# Patient Record
Sex: Female | Born: 2000 | Race: Black or African American | Hispanic: No | Marital: Single | Smoking: Never smoker
Health system: Southern US, Community
[De-identification: ages and names within clinical notes are randomized; demographics above are authoritative.]

## PROBLEM LIST (undated history)

## (undated) DIAGNOSIS — L309 Dermatitis, unspecified: Secondary | ICD-10-CM

---
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
## 2018-03-12 ENCOUNTER — Encounter (HOSPITAL_COMMUNITY): Payer: Self-pay | Admitting: Emergency Medicine

## 2018-03-12 ENCOUNTER — Emergency Department (HOSPITAL_COMMUNITY)
Admission: EM | Admit: 2018-03-12 | Discharge: 2018-03-12 | Disposition: A | Discharge status: Home / Self Care | Payer: BLUE CROSS/BLUE SHIELD | Attending: Emergency Medicine | Admitting: Emergency Medicine

## 2018-03-12 ENCOUNTER — Other Ambulatory Visit: Payer: Self-pay

## 2018-03-12 DIAGNOSIS — Z79899 Other long term (current) drug therapy: Secondary | ICD-10-CM | POA: Insufficient documentation

## 2018-03-12 DIAGNOSIS — J45909 Unspecified asthma, uncomplicated: Secondary | ICD-10-CM | POA: Insufficient documentation

## 2018-03-12 DIAGNOSIS — J029 Acute pharyngitis, unspecified: Secondary | ICD-10-CM | POA: Diagnosis present

## 2018-03-12 LAB — GROUP A STREP BY PCR: Group A Strep by PCR: NOT DETECTED

## 2018-03-12 MED ORDER — DEXAMETHASONE 10 MG/ML FOR PEDIATRIC ORAL USE
10.0000 mg | Freq: Once | INTRAMUSCULAR | Status: AC
  Administered 2018-03-12: 10 mg via ORAL
  Filled 2018-03-12 (×3): qty 1

## 2018-03-12 NOTE — ED Provider Notes (Signed)
[facility] Provider Note   CSN: [id number] Arrival date & time: 03/12/18  0946     History   Chief Complaint Chief Complaint  Patient presents with  . Sore Throat    HPI Tonya Harding is a 17 y.o. female.  17 year old female presents with complaint of sore throat, onset yesterday.  Also reports pain in her ears, sore throat worse with swallowing.  Denies fevers, chills, cough, nausea, vomiting or congestion.  Exposed to her roommate who has had a cold.  No other complaints or concerns.     Past Medical History:  Diagnosis Date  . Asthma   . Eczema     Patient Active Problem List   Diagnosis Date Noted  . Eczema 11/14/2014  . Unspecified constipation 10/29/2013  . ALLERGIC RHINITIS 01/19/2008  . ASTHMA, INTERMITTENT 01/19/2008    History reviewed. No pertinent surgical history.   OB History   None      Home Medications    Prior to Admission medications   Medication Sig Start Date End Date Taking? Authorizing Provider  albuterol (PROAIR HFA) 108 (90 Base) MCG/ACT inhaler Inhale 2 puffs into the lungs every 4 (four) hours as needed for shortness of breath. 12/21/16   Palma HolterGunadasa, Kanishka G, MD  cetirizine (ZYRTEC) 10 MG tablet TAKE 1 TABLET (10 MG TOTAL) BY MOUTH DAILY AS NEEDED FOR ALLERGIES. 12/18/14   Glori LuisSonnenberg, Eric G, MD  ibuprofen (ADVIL,MOTRIN) 600 MG tablet Take 1 tablet (600 mg total) by mouth every 6 (six) hours as needed for mild pain. 03/14/16   Lowanda FosterBrewer, Mindy, NP  norgestimate-ethinyl estradiol (ORTHO-CYCLEN,SPRINTEC,PREVIFEM) 0.25-35 MG-MCG tablet Take 1 tablet by mouth daily. 01/10/17   Palma HolterGunadasa, Kanishka G, MD  triamcinolone cream (KENALOG) 0.1 % APPLY TOPICALLY TWICE DAILY FOR 5 DAYS, THEN TWICE DAILY AS NEEDED FOR RASH 09/21/16   Palma HolterGunadasa, Kanishka G, MD    Family History Family History  Problem Relation Age of Onset  . Sleep apnea Mother   . Diabetes Sister        23yo  . Thyroid disease Sister   . Heart  failure Maternal Grandmother   . Hypertension Maternal Grandmother   . Hyperlipidemia Maternal Grandmother   . Diabetes Maternal Grandmother   . Diabetes Paternal Grandmother   . Stroke Paternal Grandmother   . Hyperlipidemia Paternal Grandmother     Social History Social History   Tobacco Use  . Smoking status: Never Smoker  . Smokeless tobacco: Never Used  Substance Use Topics  . Alcohol use: No  . Drug use: Not on file     Allergies   Penicillins   Review of Systems Review of Systems  Constitutional: Negative for chills and fever.  HENT: Positive for ear pain and sore throat. Negative for congestion, postnasal drip, rhinorrhea, sinus pressure, sinus pain, sneezing, trouble swallowing and voice change.   Eyes: Negative for discharge and redness.  Respiratory: Negative for cough, shortness of breath and wheezing.   Musculoskeletal: Negative for arthralgias and myalgias.  Skin: Negative for rash and wound.  Allergic/Immunologic: Negative for immunocompromised state.  Neurological: Negative for weakness and headaches.  Hematological: Negative for adenopathy.  All other systems reviewed and are negative.    Physical Exam Updated Vital Signs BP 119/79 (BP Location: Right Arm)   Pulse 96   Temp 99.4 F (37.4 C) (Oral)   Resp 20   Ht 5\' 6"  (1.676 m)   Wt 58.1 kg   LMP 03/05/2018   SpO2 99%  BMI 20.66 kg/m   Physical Exam  Constitutional: She is oriented to person, place, and time. She appears well-developed and well-nourished. No distress.  HENT:  Head: Normocephalic and atraumatic.  Right Ear: Tympanic membrane normal.  Left Ear: Tympanic membrane and ear canal normal.  Mouth/Throat: Uvula is midline and mucous membranes are normal. No uvula swelling. Posterior oropharyngeal erythema present. No oropharyngeal exudate, posterior oropharyngeal edema or tonsillar abscesses. Tonsils are 1+ on the right. Tonsils are 1+ on the left. No tonsillar exudate.  Neck:  Neck supple.  Cardiovascular: Normal rate, regular rhythm, normal heart sounds and intact distal pulses.  Pulmonary/Chest: Effort normal and breath sounds normal.  Lymphadenopathy:    She has no cervical adenopathy.  Neurological: She is alert and oriented to person, place, and time.  Skin: Skin is warm and dry. No rash noted. She is not diaphoretic.  Psychiatric: She has a normal mood and affect. Her behavior is normal.  Nursing note and vitals reviewed.    ED Treatments / Results  Labs (all labs ordered are listed, but only abnormal results are displayed) Labs Reviewed  GROUP A STREP BY PCR    EKG None  Radiology No results found.  Procedures Procedures (including critical care time)  Medications Ordered in ED Medications  dexamethasone (DECADRON) 10 MG/ML injection for Pediatric ORAL use 10 mg (has no administration in time range)     Initial Impression / Assessment and Plan / ED Course  I have reviewed the triage vital signs and the nursing notes.  Pertinent labs & imaging results that were available during my care of the patient were reviewed by me and considered in my medical decision making (see chart for details).  Clinical Course as of Mar 12 1117  Sun Mar 12, 2018  [id number] 17 year old female with complaint of sore throat and ear pain onset yesterday.  Rapid strep test is negative.  Patient was given a dose of Decadron to gargle and swallow while in the ER.  Recheck with PCP.   [LM]    Clinical Course User Index [LM] Jeannie Fend, PA-C   Final Clinical Impressions(s) / ED Diagnoses   Final diagnoses:  Viral pharyngitis    ED Discharge Orders    None       Alden Hipp 03/12/18 1118    Benjiman Core, MD 03/12/18 [phone]

## 2018-03-12 NOTE — ED Triage Notes (Signed)
Pt. Stated, Tonya Harding had a sore throat since yesterday

## 2018-03-12 NOTE — ED Notes (Signed)
Waiting on the med from pharm

## 2018-03-12 NOTE — Discharge Instructions (Addendum)
Your strep test today was negative.  Your sore throat is likely caused by a virus.  You were given a dose of Decadron while in the emergency room, this is a steroid medication that should help improve your pain over the next 24 hours.  You can also gargle liquid Motrin or liquid Tylenol and swallow for additional pain relief.  Recheck with student health or your primary care provider in the next 2 days, return to ER for worsening symptoms.

## 2018-06-13 ENCOUNTER — Other Ambulatory Visit: Payer: Self-pay | Admitting: Internal Medicine

## 2018-08-27 ENCOUNTER — Other Ambulatory Visit: Payer: Self-pay

## 2018-08-27 ENCOUNTER — Emergency Department (HOSPITAL_COMMUNITY)
Admission: EM | Admit: 2018-08-27 | Discharge: 2018-08-27 | Disposition: A | Discharge status: Home / Self Care | Payer: BLUE CROSS/BLUE SHIELD | Attending: Emergency Medicine | Admitting: Emergency Medicine

## 2018-08-27 ENCOUNTER — Encounter (HOSPITAL_COMMUNITY): Payer: Self-pay | Admitting: Emergency Medicine

## 2018-08-27 DIAGNOSIS — R69 Illness, unspecified: Secondary | ICD-10-CM

## 2018-08-27 DIAGNOSIS — J111 Influenza due to unidentified influenza virus with other respiratory manifestations: Secondary | ICD-10-CM | POA: Insufficient documentation

## 2018-08-27 DIAGNOSIS — Z79899 Other long term (current) drug therapy: Secondary | ICD-10-CM | POA: Insufficient documentation

## 2018-08-27 DIAGNOSIS — J45909 Unspecified asthma, uncomplicated: Secondary | ICD-10-CM | POA: Insufficient documentation

## 2018-08-27 MED ORDER — ONDANSETRON 4 MG PO TBDP: 4.0000 mg | ORAL_TABLET | Freq: Three times a day (TID) | ORAL | 0 refills | Status: DC | PRN

## 2018-08-27 MED ORDER — SODIUM CHLORIDE 0.9 % IV BOLUS
1000.0000 mL | Freq: Once | INTRAVENOUS | Status: AC
  Administered 2018-08-27: 1000 mL via INTRAVENOUS

## 2018-08-27 MED ORDER — ONDANSETRON HCL 4 MG/2ML IJ SOLN
4.0000 mg | Freq: Once | INTRAMUSCULAR | Status: AC
  Administered 2018-08-27: 4 mg via INTRAVENOUS
  Filled 2018-08-27: qty 2

## 2018-08-27 MED ORDER — IBUPROFEN 800 MG PO TABS: 800.0000 mg | ORAL_TABLET | Freq: Three times a day (TID) | ORAL | 0 refills | Status: DC | PRN

## 2018-08-27 MED ORDER — KETOROLAC TROMETHAMINE 30 MG/ML IJ SOLN
30.0000 mg | Freq: Once | INTRAMUSCULAR | Status: AC
  Administered 2018-08-27: 30 mg via INTRAVENOUS
  Filled 2018-08-27: qty 1

## 2018-08-27 MED ORDER — ACETAMINOPHEN 500 MG PO TABS
1000.0000 mg | ORAL_TABLET | Freq: Once | ORAL | Status: AC
  Administered 2018-08-27: 1000 mg via ORAL
  Filled 2018-08-27 (×2): qty 2

## 2018-08-27 MED ORDER — OSELTAMIVIR PHOSPHATE 75 MG PO CAPS: 75.0000 mg | ORAL_CAPSULE | Freq: Two times a day (BID) | ORAL | 0 refills | Status: DC

## 2018-08-27 NOTE — Discharge Instructions (Addendum)
It was my pleasure taking care of you today!   Alternate between Tylenol (500-1000mg ) and Ibuprofen every 4-6 hours as needed for fevers / body aches.   Zofran as needed for nausea.   Follow up with your primary care doctor if you are not feeling better in the next 5-7 days.   Return to ER for difficulty breathing, uncontrolled vomiting, new or worsening symptoms, any additional concerns.

## 2018-08-27 NOTE — ED Triage Notes (Signed)
Patient arrives via [facility] for c/o chills, fever, body aches since yesterday. Recent exposure to sick family members. vss 95 hr cbg 140, bp 134 palpated

## 2018-08-27 NOTE — ED Notes (Signed)
Pt states she took ibuprofen yesterday and tried to take liquid tylenol today but vomited afterwards.

## 2018-08-27 NOTE — ED Provider Notes (Signed)
[facility] EMERGENCY DEPARTMENT Provider Note   CSN: [id number] Arrival date & time: 08/27/18  0737    History   Chief Complaint Chief Complaint  Patient presents with  . Generalized Body Aches  . Fever  . Chills    HPI Tonya Harding is a 18 y.o. female.     The history is provided by the patient and medical records. No language interpreter was used.  Fever  Associated symptoms: chills, congestion, myalgias, nausea and vomiting   Associated symptoms: no chest pain, no cough, no diarrhea, no dysuria and no rash    Tonya Harding is a 18 y.o. female  with a PMH of asthma, eczema who presents to the Emergency Department complaining of acute onset of persistent generalized body aches, chills and headache which began about 6 PM last night.  Nauseated with nausea.  She tried to take both Tylenol and then liquid ibuprofen earlier this morning, but immediately threw up.  She has not tried to eat or drink anything since then out of concerns for emesis.  She was unaware that she had a fever as she did not check her temperature at home, but does states that she has not surprised by this because of how she feels.  Does have sick contact with URI, but is unaware of any flu contacts.  Denies any chest pain or trouble breathing.  Denies abdominal pain, constipation, diarrhea or blood in the stool.  Past Medical History:  Diagnosis Date  . Asthma   . Eczema     Patient Active Problem List   Diagnosis Date Noted  . Eczema 11/14/2014  . Unspecified constipation 10/29/2013  . ALLERGIC RHINITIS 01/19/2008  . ASTHMA, INTERMITTENT 01/19/2008    History reviewed. No pertinent surgical history.   OB History   No obstetric history on file.      Home Medications    Prior to Admission medications   Medication Sig Start Date End Date Taking? Authorizing Provider  cetirizine (ZYRTEC) 10 MG tablet TAKE 1 TABLET (10 MG TOTAL) BY MOUTH DAILY AS NEEDED FOR ALLERGIES. 12/18/14    Glori Luis, MD  ibuprofen (ADVIL,MOTRIN) 800 MG tablet Take 1 tablet (800 mg total) by mouth every 8 (eight) hours as needed (Fever, body aches). 08/27/18   , Chase Picket, PA-C  norgestimate-ethinyl estradiol (ORTHO-CYCLEN,SPRINTEC,PREVIFEM) 0.25-35 MG-MCG tablet Take 1 tablet by mouth daily. 01/10/17   Palma Holter, MD  ondansetron (ZOFRAN ODT) 4 MG disintegrating tablet Take 1 tablet (4 mg total) by mouth every 8 (eight) hours as needed for nausea or vomiting. 08/27/18   , Chase Picket, PA-C  oseltamivir (TAMIFLU) 75 MG capsule Take 1 capsule (75 mg total) by mouth every 12 (twelve) hours. 08/27/18   , Chase Picket, PA-C  PROAIR HFA 108 [id number] Base) MCG/ACT inhaler INHALE 2 PUFFS EVERY 4 HOURS AS NEEDED FOR SHORTNESS OF BREATH 06/15/18   Diallo, Abdoulaye, MD  triamcinolone cream (KENALOG) 0.1 % APPLY TOPICALLY TWICE DAILY FOR 5 DAYS, THEN TWICE DAILY AS NEEDED FOR RASH 09/21/16   Palma Holter, MD    Family History Family History  Problem Relation Age of Onset  . Sleep apnea Mother   . Diabetes Sister        23yo  . Thyroid disease Sister   . Heart failure Maternal Grandmother   . Hypertension Maternal Grandmother   . Hyperlipidemia Maternal Grandmother   . Diabetes Maternal Grandmother   . Diabetes Paternal Grandmother   . Stroke  Paternal Grandmother   . Hyperlipidemia Paternal Grandmother     Social History Social History   Tobacco Use  . Smoking status: Never Smoker  . Smokeless tobacco: Never Used  Substance Use Topics  . Alcohol use: No  . Drug use: Not on file     Allergies   Penicillins   Review of Systems Review of Systems  Constitutional: Positive for chills and fever.  HENT: Positive for congestion.   Respiratory: Negative for cough and shortness of breath.   Cardiovascular: Negative for chest pain.  Gastrointestinal: Positive for nausea and vomiting. Negative for abdominal pain, blood in stool, constipation and diarrhea.    Genitourinary: Negative for dysuria.  Musculoskeletal: Positive for arthralgias and myalgias. Negative for back pain and neck pain.  Skin: Negative for rash.     Physical Exam Updated Vital Signs BP 109/71 (BP Location: Left Arm)   Pulse (!) 115   Temp (!) 100.7 F (38.2 C) (Oral)   Resp 16   Ht  (1.676 m)   Wt 61.2 kg   SpO2 98%   BMI 21.79 kg/m   Physical Exam Vitals signs and nursing note reviewed.  Constitutional:      General: She is not in acute distress.    Appearance: She is well-developed.  HENT:     Head: Normocephalic and atraumatic.     Mouth/Throat:     Comments: No tonsillar hypertrophy or exudates. Neck:     Musculoskeletal: Neck supple.     Comments: No meningeal signs. Cardiovascular:     Heart sounds: Normal heart sounds. No murmur.     Comments: Tachycardic, but regular. Pulmonary:     Effort: Pulmonary effort is normal. No respiratory distress.     Breath sounds: Normal breath sounds.     Comments: Lungs are clear to auscultation bilaterally. Abdominal:     General: There is no distension.     Palpations: Abdomen is soft.     Tenderness: There is no abdominal tenderness.     Comments: No abdominal tenderness.  Skin:    General: Skin is warm and dry.  Neurological:     Mental Status: She is alert and oriented to person, place, and time.      ED Treatments / Results  Labs (all labs ordered are listed, but only abnormal results are displayed) Labs Reviewed - No data to display  EKG None  Radiology No results found.  Procedures Procedures (including critical care time)  Medications Ordered in ED Medications  acetaminophen (TYLENOL) tablet 1,000 mg (1,000 mg Oral Given 08/27/18 0946)  ketorolac (TORADOL) 30 MG/ML injection 30 mg (30 mg Intravenous Given 08/27/18 0919)  ondansetron (ZOFRAN) injection 4 mg (4 mg Intravenous Given 08/27/18 0919)  sodium chloride 0.9 % bolus 1,000 mL (0 mLs Intravenous Stopped 08/27/18 1022)      Initial Impression / Assessment and Plan / ED Course  I have reviewed the triage vital signs and the nursing notes.  Pertinent labs & imaging results that were available during my care of the patient were reviewed by me and considered in my medical decision making (see chart for details).        MDM Number of Diagnoses or Management Options Influenza-like illness:   Tonya Harding is a 17 y.o. female who presents to ED for influenza-like-illness.  On exam, patient initially from a temperature up to 102.6.  Antipyretics and liter of fluids.  Lungs are clear to auscultation bilaterally.  She tried to take antipyretics  at home, but threw it up.  Given Zofran in ED.  On reevaluation, patient now tolerating p.o.  Taken Tylenol here in ED without difficulty.  She feels much improved.  We discussed the risks and benefits of Tamiflu and she would like to proceed with treatment.  Rx given. Patient discharged home with instructions to orally hydrate, rest, and ibuprofen/Tylenol for fever/body aches. Rx for Zofran also given. PCP follow up strongly encouraged. Reasons to return to ER discussed. All questions answered.   Final Clinical Impressions(s) / ED Diagnoses   Final diagnoses:  Influenza-like illness    ED Discharge Orders         Ordered    ibuprofen (ADVIL,MOTRIN) 800 MG tablet  Every 8 hours PRN     08/27/18 1014    ondansetron (ZOFRAN ODT) 4 MG disintegrating tablet  Every 8 hours PRN     08/27/18 1014    oseltamivir (TAMIFLU) 75 MG capsule  Every 12 hours     08/27/18 1014           , Chase Picket, PA-C 08/27/18 1046    Shaune Pollack, MD 09/01/18 0401

## 2019-12-24 ENCOUNTER — Encounter (HOSPITAL_COMMUNITY): Payer: Self-pay

## 2019-12-24 ENCOUNTER — Emergency Department (HOSPITAL_COMMUNITY)
Admission: EM | Admit: 2019-12-24 | Discharge: 2019-12-24 | Disposition: A | Discharge status: Home / Self Care | Payer: Medicaid Other | Attending: Emergency Medicine | Admitting: Emergency Medicine

## 2019-12-24 ENCOUNTER — Other Ambulatory Visit: Payer: Self-pay

## 2019-12-24 ENCOUNTER — Emergency Department (HOSPITAL_COMMUNITY): Payer: Medicaid Other

## 2019-12-24 DIAGNOSIS — R0789 Other chest pain: Secondary | ICD-10-CM | POA: Insufficient documentation

## 2019-12-24 DIAGNOSIS — M25512 Pain in left shoulder: Secondary | ICD-10-CM | POA: Insufficient documentation

## 2019-12-24 DIAGNOSIS — F17200 Nicotine dependence, unspecified, uncomplicated: Secondary | ICD-10-CM | POA: Insufficient documentation

## 2019-12-24 LAB — CBC
HCT: 39.7 % (ref 36.0–46.0)
Hemoglobin: 12.6 g/dL (ref 12.0–15.0)
MCH: 26.2 pg (ref 26.0–34.0)
MCHC: 31.7 g/dL (ref 30.0–36.0)
MCV: 82.5 fL (ref 80.0–100.0)
Platelets: 226 10*3/uL (ref 150–400)
RBC: 4.81 MIL/uL (ref 3.87–5.11)
RDW: 13.2 % (ref 11.5–15.5)
WBC: 5.3 10*3/uL (ref 4.0–10.5)
nRBC: 0 % (ref 0.0–0.2)

## 2019-12-24 LAB — BASIC METABOLIC PANEL
Anion gap: 9 (ref 5–15)
BUN: 7 mg/dL (ref 6–20)
CO2: 24 mmol/L (ref 22–32)
Calcium: 8.7 mg/dL — ABNORMAL LOW (ref 8.9–10.3)
Chloride: 104 mmol/L (ref 98–111)
Creatinine, Ser: 0.65 mg/dL (ref 0.44–1.00)
GFR calc Af Amer: >60 mL/min (ref >60)
GFR calc non Af Amer: >60 mL/min (ref >60)
Glucose, Bld: 92 mg/dL (ref 70–99)
Potassium: 3.5 mmol/L (ref 3.5–5.1)
Sodium: 137 mmol/L (ref 135–145)

## 2019-12-24 LAB — D-DIMER, QUANTITATIVE: D-Dimer, Quant: <0.27 ug/mL-FEU (ref 0.00–0.50)

## 2019-12-24 LAB — I-STAT BETA HCG BLOOD, ED (MC, WL, AP ONLY): I-stat hCG, quantitative: <5 m[IU]/mL (ref <5)

## 2019-12-24 LAB — TROPONIN I (HIGH SENSITIVITY): Troponin I (High Sensitivity): 3 ng/L (ref <18)

## 2019-12-24 MED ORDER — ALUM & MAG HYDROXIDE-SIMETH 200-200-20 MG/5ML PO SUSP
30.0000 mL | Freq: Once | ORAL | Status: AC
  Administered 2019-12-24: 30 mL via ORAL
  Filled 2019-12-24: qty 30

## 2019-12-24 MED ORDER — LIDOCAINE VISCOUS HCL 2 % MT SOLN
15.0000 mL | Freq: Once | OROMUCOSAL | Status: AC
Start: 2019-12-24 — End: 2019-12-24
  Administered 2019-12-24: 15 mL via ORAL
  Filled 2019-12-24: qty 15

## 2019-12-24 MED ORDER — OMEPRAZOLE 20 MG PO CPDR: 20.0000 mg | DELAYED_RELEASE_CAPSULE | Freq: Every day | ORAL | 0 refills | Status: DC

## 2019-12-24 NOTE — ED Provider Notes (Signed)
[facility] EMERGENCY DEPARTMENT Provider Note   CSN: [id number] Arrival date & time: 12/24/19  1020     History Chief Complaint  Patient presents with  . Chest Pain    Tonya Harding is a 19 y.o. female.  19 year old female presents with complaint of midsternal chest discomfort.  Patient states pain started at 2:00 this morning, she was awake at that time, had just eaten peas and alfredo pasta.  Patient tried sitting up in bed and drinking some water, no relief.  Reports pain is in aching pain, does not radiate, is worse with taking a deep breath.  Pain has improved as of this morning but is still present.  Also reports discomfort in her left shoulder, worse with palpation and movement.  Denies abdominal pain, nausea, vomiting, shortness of breath.  Patient recently stopped smoking weed, does vape.  Family history significant for grandmother who had a DVT and PE for unknown reasons.  Patient does not take OCPs, denies recent travel or leg swelling.  No other complaints or concerns today.        Past Medical History:  Diagnosis Date  . Asthma   . Eczema     Patient Active Problem List   Diagnosis Date Noted  . Eczema 11/14/2014  . Unspecified constipation 10/29/2013  . ALLERGIC RHINITIS 01/19/2008  . ASTHMA, INTERMITTENT 01/19/2008    History reviewed. No pertinent surgical history.   OB History   No obstetric history on file.     Family History  Problem Relation Age of Onset  . Sleep apnea Mother   . Diabetes Sister        23yo  . Thyroid disease Sister   . Heart failure Maternal Grandmother   . Hypertension Maternal Grandmother   . Hyperlipidemia Maternal Grandmother   . Diabetes Maternal Grandmother   . Diabetes Paternal Grandmother   . Stroke Paternal Grandmother   . Hyperlipidemia Paternal Grandmother     Social History   Tobacco Use  . Smoking status: Never Smoker  . Smokeless tobacco: Never Used  Substance Use Topics  . Alcohol  use: No  . Drug use: Not on file    Home Medications Prior to Admission medications   Medication Sig Start Date End Date Taking? Authorizing Provider  acetaminophen (TYLENOL) 500 MG tablet Take 1,000 mg by mouth every 6 (six) hours as needed for mild pain.   Yes [provider]  diphenhydrAMINE (BENADRYL) 25 MG tablet Take 25 mg by mouth every 6 (six) hours as needed for allergies.   Yes [provider]  ibuprofen (ADVIL) 200 MG tablet Take 400 mg by mouth every 6 (six) hours as needed for moderate pain.   Yes [provider]  cetirizine (ZYRTEC) 10 MG tablet TAKE 1 TABLET (10 MG TOTAL) BY MOUTH DAILY AS NEEDED FOR ALLERGIES. Patient not taking: Reported on 12/24/2019 12/18/14   Glori Luis, MD  ibuprofen (ADVIL,MOTRIN) 800 MG tablet Take 1 tablet (800 mg total) by mouth every 8 (eight) hours as needed (Fever, body aches). Patient not taking: Reported on 12/24/2019 08/27/18   Ward, Chase Picket, PA-C  norgestimate-ethinyl estradiol (ORTHO-CYCLEN,SPRINTEC,PREVIFEM) 0.25-35 MG-MCG tablet Take 1 tablet by mouth daily. Patient not taking: Reported on 12/24/2019 01/10/17   Palma Holter, MD  omeprazole (PRILOSEC) 20 MG capsule Take 1 capsule (20 mg total) by mouth daily. 12/24/19   Jeannie Fend, PA-C  ondansetron (ZOFRAN ODT) 4 MG disintegrating tablet Take 1 tablet (4 mg total) by  mouth every 8 (eight) hours as needed for nausea or vomiting. Patient not taking: Reported on 12/24/2019 08/27/18   Ward, Chase Picket, PA-C  oseltamivir (TAMIFLU) 75 MG capsule Take 1 capsule (75 mg total) by mouth every 12 (twelve) hours. Patient not taking: Reported on 12/24/2019 08/27/18   Ward, Marijean Niemann Pilcher, PA-C  PROAIR HFA [phone] Base) MCG/ACT inhaler INHALE 2 PUFFS EVERY 4 HOURS AS NEEDED FOR SHORTNESS OF BREATH Patient not taking: Reported on 12/24/2019 06/15/18   Diallo, Lilia Argue, MD  triamcinolone cream (KENALOG) 0.1 % APPLY TOPICALLY TWICE DAILY FOR 5 DAYS, THEN TWICE DAILY AS  NEEDED FOR RASH Patient not taking: Reported on 12/24/2019 09/21/16   Palma Holter, MD    Allergies    Penicillins  Review of Systems   Review of Systems  Constitutional: Negative for chills, diaphoresis and fever.  Respiratory: Negative for shortness of breath.   Cardiovascular: Positive for chest pain.  Gastrointestinal: Negative for abdominal pain, constipation, diarrhea, nausea and vomiting.  Musculoskeletal: Positive for myalgias. Negative for neck pain.  Skin: Negative for rash and wound.  Allergic/Immunologic: Negative for immunocompromised state.  Neurological: Negative for weakness.  Psychiatric/Behavioral: Negative for confusion.  All other systems reviewed and are negative.   Physical Exam Updated Vital Signs BP 115/81 (BP Location: Right Arm)   Pulse 79   Temp 98.9 F (37.2 C)   Resp 16   Ht 5\' 6"  (1.676 m)   Wt 55.8 kg   LMP 12/09/2019   SpO2 100%   BMI 19.85 kg/m   Physical Exam Vitals and nursing note reviewed.  Constitutional:      General: She is not in acute distress.    Appearance: She is well-developed. She is not diaphoretic.  HENT:     Head: Normocephalic and atraumatic.  Cardiovascular:     Rate and Rhythm: Normal rate and regular rhythm.     Heart sounds: Normal heart sounds.  Pulmonary:     Effort: Pulmonary effort is normal.     Breath sounds: Normal breath sounds.  Chest:     Chest wall: Tenderness present.    Abdominal:     Palpations: Abdomen is soft.     Tenderness: There is no abdominal tenderness.  Musculoskeletal:     Right lower leg: No edema.     Left lower leg: No edema.  Skin:    General: Skin is warm and dry.  Neurological:     Mental Status: She is alert and oriented to person, place, and time.  Psychiatric:        Behavior: Behavior normal.     ED Results / Procedures / Treatments   Labs (all labs ordered are listed, but only abnormal results are displayed) Labs Reviewed  BASIC METABOLIC PANEL -  Abnormal; Notable for the following components:      Result Value   Calcium 8.7 (*)    All other components within normal limits  CBC  D-DIMER, QUANTITATIVE (NOT AT [facility])  I-STAT BETA HCG BLOOD, ED ([facility], [facility], [facility] ONLY)  TROPONIN I (HIGH SENSITIVITY)    EKG EKG Interpretation  Date/Time:  Monday December 24 2019 10:30:04 EDT Ventricular Rate:  89 PR Interval:    QRS Duration: 72 QT Interval:  338 QTC Calculation: 411 R Axis:   76 Text Interpretation: Normal sinus rhythm with sinus arrhythmia Nonspecific ST abnormality Abnormal ECG No old tracing to compare Confirmed by [birth year] [phone]) on 12/24/2019 10:56:39 AM   Radiology DG Chest 2 View  Result Date: 12/24/2019 CLINICAL DATA:  Chest pain EXAM: CHEST - 2 VIEW COMPARISON:  03/05/2011 FINDINGS: The heart size and mediastinal contours are within normal limits. Both lungs are clear. The visualized skeletal structures are unremarkable. IMPRESSION: No acute abnormality of the lungs. Electronically Signed   By: Lauralyn Primes M.D.   On: 12/24/2019 11:13    Procedures Procedures (including critical care time)  Medications Ordered in ED Medications  alum & mag hydroxide-simeth (MAALOX/MYLANTA) 200-200-20 MG/5ML suspension 30 mL (has no administration in time range)    And  lidocaine (XYLOCAINE) 2 % viscous mouth solution 15 mL (has no administration in time range)    ED Course  I have reviewed the triage vital signs and the nursing notes.  Pertinent labs & imaging results that were available during my care of the patient were reviewed by me and considered in my medical decision making (see chart for details).  Clinical Course as of Dec 24 1230  Mon Dec 24, 2019  [id number] 19 year old female with complaint of midsternal chest discomfort, worse with taking a deep breath, onset 2 AM today.  Symptoms not improved upon arrival in the ER however still slightly present.  On exam does have mild chest wall tenderness.  Work-up is reassuring including  chest x-ray, CBC, BMP, D-dimer.  Troponin x1 is 3, hCG is negative.  EKG without acute ischemic changes. Discussed results with patient, recommend follow-up with PCP, return to ER for new or worsening symptoms.  Patient be given GI cocktail prior to leaving the ER, suspect her symptoms may be coming from having eaten late last night prior to lying down to go to sleep, will give prescription for Prilosec.   [LM]    Clinical Course User Index [LM] Alden Hipp   MDM Rules/Calculators/A&P                          Final Clinical Impression(s) / ED Diagnoses Final diagnoses:  Other chest pain    Rx / DC Orders ED Discharge Orders         Ordered    omeprazole (PRILOSEC) 20 MG capsule  Daily     Discontinue  Reprint     12/24/19 1228           Jeannie Fend, PA-C 12/24/19 1232    Melene Plan, DO 12/24/19 1407

## 2019-12-24 NOTE — ED Triage Notes (Signed)
Pt reports chest pain since last night that radiates to her left shoulder blade, states she feels like her heart is racing.

## 2019-12-24 NOTE — ED Notes (Signed)
Patient verbalizes understanding of discharge instructions. Opportunity for questioning and answers were provided. Armband removed by staff, pt discharged from ED ambulatory.   

## 2019-12-24 NOTE — Discharge Instructions (Addendum)
Your work-up today is reassuring.  Recommend follow-up with your primary care provider for further evaluation.  Return to ER for new or worsening symptoms. Take Prilosec as prescribed.

## 2020-03-19 ENCOUNTER — Emergency Department (HOSPITAL_COMMUNITY)
Admission: EM | Admit: 2020-03-19 | Discharge: 2020-03-19 | Disposition: A | Discharge status: Home / Self Care | Payer: Self-pay | Attending: Pediatric Emergency Medicine | Admitting: Pediatric Emergency Medicine

## 2020-03-19 ENCOUNTER — Encounter (HOSPITAL_COMMUNITY): Payer: Self-pay

## 2020-03-19 ENCOUNTER — Other Ambulatory Visit: Payer: Self-pay

## 2020-03-19 ENCOUNTER — Emergency Department (HOSPITAL_COMMUNITY): Payer: Self-pay

## 2020-03-19 DIAGNOSIS — J4520 Mild intermittent asthma, uncomplicated: Secondary | ICD-10-CM | POA: Insufficient documentation

## 2020-03-19 DIAGNOSIS — U071 COVID-19: Secondary | ICD-10-CM | POA: Insufficient documentation

## 2020-03-19 DIAGNOSIS — R519 Headache, unspecified: Secondary | ICD-10-CM

## 2020-03-19 LAB — URINALYSIS, ROUTINE W REFLEX MICROSCOPIC
Bilirubin Urine: NEGATIVE
Glucose, UA: NEGATIVE mg/dL
Hgb urine dipstick: NEGATIVE
Ketones, ur: NEGATIVE mg/dL
Leukocytes,Ua: NEGATIVE
Nitrite: NEGATIVE
Protein, ur: NEGATIVE mg/dL
Specific Gravity, Urine: 1.013 (ref 1.005–1.030)
pH: 6 (ref 5.0–8.0)

## 2020-03-19 LAB — RESPIRATORY PANEL BY RT PCR (FLU A&B, COVID)
Influenza A by PCR: NEGATIVE
Influenza B by PCR: NEGATIVE
SARS Coronavirus 2 by RT PCR: POSITIVE — AB

## 2020-03-19 LAB — I-STAT BETA HCG BLOOD, ED (MC, WL, AP ONLY): I-stat hCG, quantitative: <5 m[IU]/mL (ref <5)

## 2020-03-19 MED ORDER — SODIUM CHLORIDE 0.9 % IV BOLUS
1000.0000 mL | Freq: Once | INTRAVENOUS | Status: AC
  Administered 2020-03-19: 1000 mL via INTRAVENOUS

## 2020-03-19 MED ORDER — OXYMETAZOLINE HCL 0.05 % NA SOLN
2.0000 | Freq: Once | NASAL | Status: AC
  Administered 2020-03-19: 2 via NASAL
  Filled 2020-03-19: qty 30

## 2020-03-19 MED ORDER — METOCLOPRAMIDE HCL 5 MG/ML IJ SOLN
10.0000 mg | Freq: Once | INTRAMUSCULAR | Status: AC
  Administered 2020-03-19: 10 mg via INTRAVENOUS
  Filled 2020-03-19: qty 2

## 2020-03-19 MED ORDER — DIPHENHYDRAMINE HCL 50 MG/ML IJ SOLN
25.0000 mg | Freq: Once | INTRAMUSCULAR | Status: AC
  Administered 2020-03-19: 25 mg via INTRAVENOUS
  Filled 2020-03-19: qty 1

## 2020-03-19 MED ORDER — KETOROLAC TROMETHAMINE 15 MG/ML IJ SOLN
15.0000 mg | Freq: Once | INTRAMUSCULAR | Status: AC
  Administered 2020-03-19: 15 mg via INTRAVENOUS
  Filled 2020-03-19: qty 1

## 2020-03-19 NOTE — ED Notes (Signed)
ED Provider at bedside. 

## 2020-03-19 NOTE — ED Triage Notes (Signed)
Patient complains of congestion, body aches and severe headache since Sunday, unvaccinated. NAD

## 2020-03-19 NOTE — ED Provider Notes (Signed)
[facility] EMERGENCY DEPARTMENT Provider Note   CSN: [id number] Arrival date & time: 03/19/20  1157     History No chief complaint on file.   Tonya Harding is a 19 y.o. female.  Per patient she had a close contact with a Covid positive coworker a week ago.  She notes that in the last 3 days she has had body aches headache irritated throat nasal congestion and cough.  She reports that she has had tactile fevers but not taken her temperature.  She denies any chest pain or shortness of breath.  Denies any abdominal pain or urinary symptoms.  The history is provided by the patient. No language interpreter was used.  Illness Location:  Headache, body ache, nose bleeds, cough, nasal congestion Severity:  Moderate Onset quality:  Gradual Duration:  3 days Timing:  Constant Progression:  Worsening Chronicity:  New Relieved by:  Motrin Worsened by:  Nothing Ineffective treatments:  None tried Associated symptoms: congestion, cough, fever, headaches and myalgias   Associated symptoms: no abdominal pain, no chest pain and no loss of consciousness   Congestion:    Location:  Nasal   Interferes with sleep: yes     Interferes with eating/drinking: no   Cough:    Cough characteristics:  Productive   Sputum characteristics:  Manson Passey   Severity:  Moderate   Onset quality:  Gradual   Duration:  3 days   Timing:  Intermittent   Progression:  Unchanged   Chronicity:  New Fever:    Duration:  2 days   Temp source:  Subjective   Progression:  Resolved Headaches:    Severity:  Severe   Onset quality:  Gradual   Duration:  3 days   Timing:  Constant   Progression:  Waxing and waning   Chronicity:  New Myalgias:    Location:  Generalized   Quality:  Aching   Severity:  Moderate   Onset quality:  Gradual   Duration:  3 days   Timing:  Constant   Progression:  Unchanged      Past Medical History:  Diagnosis Date  . Asthma   . Eczema     Patient Active  Problem List   Diagnosis Date Noted  . Eczema 11/14/2014  . Unspecified constipation 10/29/2013  . ALLERGIC RHINITIS 01/19/2008  . ASTHMA, INTERMITTENT 01/19/2008    History reviewed. No pertinent surgical history.   OB History   No obstetric history on file.     Family History  Problem Relation Age of Onset  . Sleep apnea Mother   . Diabetes Sister        23yo  . Thyroid disease Sister   . Heart failure Maternal Grandmother   . Hypertension Maternal Grandmother   . Hyperlipidemia Maternal Grandmother   . Diabetes Maternal Grandmother   . Diabetes Paternal Grandmother   . Stroke Paternal Grandmother   . Hyperlipidemia Paternal Grandmother     Social History   Tobacco Use  . Smoking status: Never Smoker  . Smokeless tobacco: Never Used  Substance Use Topics  . Alcohol use: No  . Drug use: Not on file    Home Medications Prior to Admission medications   Medication Sig Start Date End Date Taking? Authorizing Provider  acetaminophen (TYLENOL) 500 MG tablet Take 1,000 mg by mouth every 6 (six) hours as needed for mild pain.    [provider]  cetirizine (ZYRTEC) 10 MG tablet TAKE 1 TABLET (10 MG TOTAL) BY  MOUTH DAILY AS NEEDED FOR ALLERGIES. Patient not taking: Reported on 12/24/2019 12/18/14   Glori Luis, MD  diphenhydrAMINE (BENADRYL) 25 MG tablet Take 25 mg by mouth every 6 (six) hours as needed for allergies.    [provider]  ibuprofen (ADVIL) 200 MG tablet Take 400 mg by mouth every 6 (six) hours as needed for moderate pain.    [provider]  ibuprofen (ADVIL,MOTRIN) 800 MG tablet Take 1 tablet (800 mg total) by mouth every 8 (eight) hours as needed (Fever, body aches). Patient not taking: Reported on 12/24/2019 08/27/18   Ward, Chase Picket, PA-C  norgestimate-ethinyl estradiol (ORTHO-CYCLEN,SPRINTEC,PREVIFEM) 0.25-35 MG-MCG tablet Take 1 tablet by mouth daily. Patient not taking: Reported on 12/24/2019 01/10/17   Palma Holter, MD  omeprazole (PRILOSEC) 20 MG capsule Take 1 capsule (20 mg total) by mouth daily. 12/24/19   Jeannie Fend, PA-C  ondansetron (ZOFRAN ODT) 4 MG disintegrating tablet Take 1 tablet (4 mg total) by mouth every 8 (eight) hours as needed for nausea or vomiting. Patient not taking: Reported on 12/24/2019 08/27/18   Ward, Chase Picket, PA-C  oseltamivir (TAMIFLU) 75 MG capsule Take 1 capsule (75 mg total) by mouth every 12 (twelve) hours. Patient not taking: Reported on 12/24/2019 08/27/18   Ward, Marijean Niemann Pilcher, PA-C  PROAIR HFA 108 [phone] Base) MCG/ACT inhaler INHALE 2 PUFFS EVERY 4 HOURS AS NEEDED FOR SHORTNESS OF BREATH Patient not taking: Reported on 12/24/2019 06/15/18   Diallo, Lilia Argue, MD  triamcinolone cream (KENALOG) 0.1 % APPLY TOPICALLY TWICE DAILY FOR 5 DAYS, THEN TWICE DAILY AS NEEDED FOR RASH Patient not taking: Reported on 12/24/2019 09/21/16   Palma Holter, MD    Allergies    Penicillins  Review of Systems   Review of Systems  Constitutional: Positive for fever.  HENT: Positive for congestion.   Respiratory: Positive for cough.   Cardiovascular: Negative for chest pain.  Gastrointestinal: Negative for abdominal pain.  Musculoskeletal: Positive for myalgias.  Neurological: Positive for headaches. Negative for loss of consciousness.  All other systems reviewed and are negative.   Physical Exam Updated Vital Signs BP 130/83 (BP Location: Right Arm)   Pulse 100   Temp 98.9 F (37.2 C) (Oral)   Resp 18   Ht 5\' 6"  (1.676 m)   Wt 55.8 kg   SpO2 99%   BMI 19.85 kg/m   Physical Exam Vitals and nursing note reviewed.  Constitutional:      Appearance: Normal appearance. She is normal weight.  HENT:     Head: Normocephalic and atraumatic.     Nose: Congestion present. No rhinorrhea.     Mouth/Throat:     Mouth: Mucous membranes are moist.  Eyes:     Conjunctiva/sclera: Conjunctivae normal.  Cardiovascular:     Rate and Rhythm: Regular rhythm. Tachycardia present.      Pulses: Normal pulses.     Heart sounds: Normal heart sounds. No murmur heard.   Pulmonary:     Effort: Pulmonary effort is normal. No respiratory distress.     Breath sounds: Normal breath sounds. No wheezing.  Chest:     Chest wall: No tenderness.  Abdominal:     General: Abdomen is flat. Bowel sounds are normal. There is no distension.     Palpations: Abdomen is soft.     Tenderness: There is no abdominal tenderness. There is no guarding or rebound.  Musculoskeletal:        General: Normal range of motion.  Cervical back: Normal range of motion and neck supple.  Skin:    General: Skin is warm and dry.     Capillary Refill: Capillary refill takes less than 2 seconds.  Neurological:     General: No focal deficit present.     Mental Status: She is alert.     ED Results / Procedures / Treatments   Labs (all labs ordered are listed, but only abnormal results are displayed) Labs Reviewed  RESPIRATORY PANEL BY RT PCR (FLU A&B, COVID) - Abnormal; Notable for the following components:      Result Value   SARS Coronavirus 2 by RT PCR POSITIVE (*)    All other components within normal limits  URINALYSIS, ROUTINE W REFLEX MICROSCOPIC  I-STAT BETA HCG BLOOD, ED ([facility], [facility], [facility] ONLY)    EKG None  Radiology DG Chest Portable 1 View  Result Date: 03/19/2020 CLINICAL DATA:  Cough, fever.  COVID-19 positive EXAM: PORTABLE CHEST 1 VIEW COMPARISON:  12/24/2019 FINDINGS: The heart size and mediastinal contours are within normal limits. Mildly prominent perihilar and bibasilar interstitial markings. No lobar consolidation. No pleural effusion or pneumothorax. The visualized skeletal structures are unremarkable. IMPRESSION: Mildly prominent perihilar and bibasilar interstitial markings, which may reflect developing atypical/viral infection given the patient's history. No focal consolidation. Electronically Signed   By: Duanne Guess D.O.   On: 03/19/2020 14:20    Procedures Procedures  (including critical care time)  Medications Ordered in ED Medications  metoCLOPramide (REGLAN) injection 10 mg (10 mg Intravenous Given 03/19/20 1334)  diphenhydrAMINE (BENADRYL) injection 25 mg (25 mg Intravenous Given 03/19/20 1335)  ketorolac (TORADOL) 15 MG/ML injection 15 mg (15 mg Intravenous Given 03/19/20 1334)  sodium chloride 0.9 % bolus 1,000 mL (0 mLs Intravenous Stopped 03/19/20 1424)  oxymetazoline (AFRIN) 0.05 % nasal spray 2 spray (2 sprays Each Nare Given 03/19/20 1334)    ED Course  I have reviewed the triage vital signs and the nursing notes.  Pertinent labs & imaging results that were available during my care of the patient were reviewed by me and considered in my medical decision making (see chart for details).    MDM Rules/Calculators/A&P                          19 y.o. with a constellation of symptoms after exposure to a Covid positive coworker.  Patient is nontoxic in the room and has stable vital signs other than a very mildly elevated heart rate of 100.  Patient states worst symptom at this point is the nasal congestion and headache.  Plan is to initiate a bolus and migraine cocktail as well as some Afrin to the nose and swab for Covid, and then reassess.  3:20 PM Patient positive for coronavirus.  Patient reports headache is resolved after migraine cocktail.  I recommended Tylenol or Motrin for fever as well as body aches and headache.  Discussed specific signs and symptoms of concern for which they should return to ED.  Discharge with close follow up with primary care physician if no better in next 2 days.  Patient comfortable with this plan of care.     Final Clinical Impression(s) / ED Diagnoses Final diagnoses:  COVID-19  Nonintractable headache, unspecified chronicity pattern, unspecified headache type    Rx / DC Orders ED Discharge Orders    None       Sharene Skeans, MD 03/19/20 1521

## 2020-03-19 NOTE — ED Notes (Signed)
Portable at bedside 

## 2020-03-19 NOTE — ED Notes (Signed)
Pt ambulating to the restroom to collect a urine sample.

## 2020-03-20 ENCOUNTER — Other Ambulatory Visit: Payer: Self-pay | Admitting: Nurse Practitioner

## 2020-03-20 DIAGNOSIS — U071 COVID-19: Secondary | ICD-10-CM

## 2020-03-20 NOTE — Progress Notes (Signed)
I connected by phone with Tonya Harding on 03/20/2020 at 12:12 PM to discuss the potential use of an new treatment for mild to moderate COVID-19 viral infection in non-hospitalized patients.  This patient is a 19 y.o. female that meets the FDA criteria for Emergency Use Authorization of casirivimab\imdevimab.  Has a (+) direct SARS-CoV-2 viral test result  Has mild or moderate COVID-19   Is ? 21 years of age and weighs ? 40 kg  Is NOT hospitalized due to COVID-19  Is NOT requiring oxygen therapy or requiring an increase in baseline oxygen flow rate due to COVID-19  Is within 10 days of symptom onset  Has at least one of the high risk factor(s) for progression to severe COVID-19 and/or hospitalization as defined in EUA.  Specific high risk criteria : Other high risk medical condition per CDC:  Social vulnerability   Onset 9/26. Unvaccinated.    I have spoken and communicated the following to the patient or parent/caregiver:  1. FDA has authorized the emergency use of casirivimab\imdevimab for the treatment of mild to moderate COVID-19 in adults and pediatric patients with positive results of direct SARS-CoV-2 viral testing who are 21 years of age and older weighing at least 40 kg, and who are at high risk for progressing to severe COVID-19 and/or hospitalization.  2. The significant known and potential risks and benefits of casirivimab\imdevimab, and the extent to which such potential risks and benefits are unknown.  3. Information on available alternative treatments and the risks and benefits of those alternatives, including clinical trials.  4. Patients treated with casirivimab\imdevimab should continue to self-isolate and use infection control measures (e.g., wear mask, isolate, social distance, avoid sharing personal items, clean and disinfect "high touch" surfaces, and frequent handwashing) according to CDC guidelines.   5. The patient or parent/caregiver has the option to accept  or refuse casirivimab\imdevimab .  After reviewing this information with the patient, the patient has agreed to receive one of the available covid 19 monoclonal antibodies and will be provided an appropriate fact sheet prior to infusion.Tonya Masse, DNP, AGNP-C [phone] (Infusion Center Hotline)

## 2020-03-21 ENCOUNTER — Ambulatory Visit (HOSPITAL_COMMUNITY): Payer: Medicaid Other

## 2020-03-28 ENCOUNTER — Other Ambulatory Visit: Payer: Medicaid Other

## 2020-03-28 DIAGNOSIS — Z20822 Contact with and (suspected) exposure to covid-19: Secondary | ICD-10-CM

## 2020-03-29 LAB — NOVEL CORONAVIRUS, NAA: SARS-CoV-2, NAA: DETECTED — AB

## 2020-03-29 LAB — SARS-COV-2, NAA 2 DAY TAT

## 2020-04-04 ENCOUNTER — Other Ambulatory Visit: Payer: Medicaid Other

## 2020-04-04 DIAGNOSIS — Z20822 Contact with and (suspected) exposure to covid-19: Secondary | ICD-10-CM

## 2020-04-07 LAB — NOVEL CORONAVIRUS, NAA: SARS-CoV-2, NAA: NOT DETECTED

## 2020-04-08 ENCOUNTER — Ambulatory Visit: Payer: Medicaid Other | Attending: Internal Medicine

## 2020-04-08 DIAGNOSIS — Z23 Encounter for immunization: Secondary | ICD-10-CM

## 2020-04-08 NOTE — Progress Notes (Signed)
° °  Covid-19 Vaccination Clinic  Name:  Tonya Harding    MRN: [id number] DOB: [birth year]  04/08/2020  [name] was observed post Covid-19 immunization for 15 minutes without incident. She was provided with Vaccine Information Sheet and instruction to access the V-Safe system.   [name] was instructed to call 911 with any severe reactions post vaccine:  Difficulty breathing   Swelling of face and throat   A fast heartbeat   A bad rash all over body   Dizziness and weakness   Immunizations Administered    Name Date Dose VIS Date Route   Pfizer COVID-19 Vaccine 04/08/2020 12:24 PM 0.3 mL 08/15/2018 Intramuscular   Manufacturer: [employer], [employer]   Lot: [id number]   NDC: [address]-4

## 2020-04-29 ENCOUNTER — Ambulatory Visit: Payer: Medicaid Other

## 2020-07-28 ENCOUNTER — Encounter: Payer: Medicaid Other | Admitting: Family

## 2020-07-28 NOTE — Progress Notes (Signed)
Patient did not show for appointment.   

## 2020-08-25 ENCOUNTER — Ambulatory Visit (INDEPENDENT_AMBULATORY_CARE_PROVIDER_SITE_OTHER): Payer: BC Managed Care – PPO | Admitting: Nurse Practitioner

## 2020-08-25 ENCOUNTER — Encounter: Payer: Self-pay | Admitting: Nurse Practitioner

## 2020-08-25 ENCOUNTER — Other Ambulatory Visit: Payer: Self-pay

## 2020-08-25 VITALS — BP 102/80 | HR 115 | Temp 98.6°F | Ht 65.4 in | Wt 116.8 lb

## 2020-08-25 DIAGNOSIS — R1032 Left lower quadrant pain: Secondary | ICD-10-CM | POA: Diagnosis not present

## 2020-08-25 DIAGNOSIS — R110 Nausea: Secondary | ICD-10-CM

## 2020-08-25 DIAGNOSIS — Z23 Encounter for immunization: Secondary | ICD-10-CM | POA: Diagnosis not present

## 2020-08-25 DIAGNOSIS — R634 Abnormal weight loss: Secondary | ICD-10-CM | POA: Diagnosis not present

## 2020-08-25 DIAGNOSIS — R000 Tachycardia, unspecified: Secondary | ICD-10-CM

## 2020-08-25 DIAGNOSIS — F1290 Cannabis use, unspecified, uncomplicated: Secondary | ICD-10-CM

## 2020-08-25 DIAGNOSIS — Z13228 Encounter for screening for other metabolic disorders: Secondary | ICD-10-CM

## 2020-08-25 DIAGNOSIS — R0600 Dyspnea, unspecified: Secondary | ICD-10-CM

## 2020-08-25 DIAGNOSIS — R0609 Other forms of dyspnea: Secondary | ICD-10-CM

## 2020-08-25 DIAGNOSIS — Z7689 Persons encountering health services in other specified circumstances: Secondary | ICD-10-CM

## 2020-08-25 DIAGNOSIS — Z1159 Encounter for screening for other viral diseases: Secondary | ICD-10-CM

## 2020-08-25 DIAGNOSIS — Z8616 Personal history of COVID-19: Secondary | ICD-10-CM

## 2020-08-25 MED ORDER — ALBUTEROL SULFATE HFA 108 (90 BASE) MCG/ACT IN AERS: 2.0000 | INHALATION_SPRAY | Freq: Four times a day (QID) | RESPIRATORY_TRACT | 2 refills | Status: AC | PRN

## 2020-08-25 NOTE — Progress Notes (Signed)
This visit occurred during the SARS-CoV-2 public health emergency.  Safety protocols were in place, including screening questions prior to the visit, additional usage of staff PPE, and extensive cleaning of exam room while observing appropriate contact time as indicated for disinfecting solutions.  Subjective:     Patient ID: Tonya Harding , female    DOB: December 02, 2000 , 20 y.o.   MRN: [id number]   Chief Complaint  Patient presents with  . Establish Care  . Abdominal Pain    Patient stated when she eats certain foods it causes her stomach to hurt. She also reports she is unable to eat certain foods due to the smell. She also reports having some gas sometimes.     HPI  Patient presents today to establish primary care. She stated she has been having some stomach issues. She also reporting that when she goes to donate blood they always tell her that her blood pressure and pulse are both high.  She was going to [facility] not seen in at least 2 years.  She is working for [employer] recently started.  She was going to [employer] has not been since Fall of 2019 - had switched to [employer], she has 2 years of college education.  She is planning to go back in the Summer or Fall.    PMH- Eczema and Asthma  Sister diagnosed with Type 1 diabetes about one year ago. Sister with stomach issues (different mothers)  Her symptoms have been ongoing for the last 2 years and has not had a work up done. She does notice with spicy foods had diarrhea and now is constipated.  Her starting weight was last summer 135 lbs. Has worsened over the last 2 months, she was 122 lbs.  She has not seen a stomach provider.   Drinks approximately 1 alcoholic beverage a week. She has been using vape (since last May) and smokes marijuana (multiple times a day)   She had covid 6 months and has been having intermittent episodes of shortness of breath.   Abdominal Pain This is a chronic problem. The current  episode started more than 1 year ago. The onset quality is gradual. The pain is located in the LLQ. The quality of the pain is colicky and sharp. Pertinent negatives include no anorexia, constipation or headaches. The pain is aggravated by eating. Relieved by: lies on her side to make better. She has tried nothing for the symptoms.     Past Medical History:  Diagnosis Date  . Asthma   . Eczema      Family History  Problem Relation Age of Onset  . Sleep apnea Mother   . Polycystic ovary syndrome Mother   . Diabetes Sister        85yo  . Thyroid disease Sister   . Polycystic ovary syndrome Sister   . Heart failure Maternal Grandmother   . Hypertension Maternal Grandmother   . Hyperlipidemia Maternal Grandmother   . Diabetes Maternal Grandmother   . Diabetes Paternal Grandmother   . Stroke Paternal Grandmother   . Hyperlipidemia Paternal Grandmother      Current Outpatient Medications:  .  albuterol (VENTOLIN HFA) 108 (90 Base) MCG/ACT inhaler, Inhale 2 puffs into the lungs every 6 (six) hours as needed for wheezing or shortness of breath., Disp: 18 g, Rfl: 2   Allergies  Allergen Reactions  . Penicillins     REACTION: itching and ?rash     Review of Systems  Constitutional: Negative.   HENT: Negative.   Eyes: Negative.   Respiratory: Negative.   Cardiovascular: Negative.   Gastrointestinal: Positive for abdominal pain. Negative for anorexia and constipation.  Endocrine: Negative.   Genitourinary: Negative.   Musculoskeletal: Negative.   Skin: Negative.   Neurological: Negative.  Negative for headaches.  Hematological: Negative.   Psychiatric/Behavioral: Negative.      Today's Vitals   08/25/20 1130  BP: 102/80  Pulse: (!) 115  Temp: 98.6 F (37 C)  TempSrc: Oral  SpO2: 96%  Weight: 116 lb 12.8 oz (53 kg)  Height: 5' 5.4" (1.661 m)  PainSc: 0-No pain   Body mass index is 19.2 kg/m.   Objective:  Physical Exam Constitutional:      General: She is not  in acute distress.    Appearance: She is well-developed and normal weight.  Cardiovascular:     Rate and Rhythm: Tachycardia present.  Abdominal:     General: Abdomen is flat and scaphoid. Bowel sounds are normal.     Palpations: Abdomen is rigid.  Skin:    Capillary Refill: Capillary refill takes less than 2 seconds.  Neurological:     General: No focal deficit present.     Mental Status: She is alert and oriented to person, place, and time.  Psychiatric:        Mood and Affect: Mood normal.        Behavior: Behavior normal.         Assessment And Plan:     1. Left lower quadrant abdominal pain  This has been ongoing for the last 2 years. I will check an ultrasound and additional labs such as h pylori  Her discomfort comes after eating consideration about gallbladder vs duodenal ulcer - CBC - CMP14+EGFR - H Pylori, IGM, IGG, IGA AB - Amylase - Lipase - TSH - US Abdomen Complete; Future  2. Nausea - CMP14+EGFR - Hemoglobin A1c - US Abdomen Complete; Future  3. Abnormal weight loss Wt Readings from Last 3 Encounters:  08/25/20 116 lb 12.8 oz (53 kg)  03/19/20 123 lb (55.8 kg)  12/24/19 123 lb (55.8 kg)   She may need to follow up with her GI provider I have also given her a probiotic - Hemoglobin A1c  4. Marijuana use  Encouraged to limit her intake of marijuana as this can cause excessive nausea.   5. Exertional dyspnea  This may be related to her history of covid, I will try her on an albuterol inhaler if she is not better will refer to pulmonologist or post covid clinic - albuterol (VENTOLIN HFA) 108 (90 Base) MCG/ACT inhaler; Inhale 2 puffs into the lungs every 6 (six) hours as needed for wheezing or shortness of breath.  Dispense: 18 g; Refill: 2  6. Tachycardia  EKG done HR 84, this may have been related to first time at our office  She is encouraged to avoid caffeine - EKG 12-Lead  7. Encounter for immunization  Influenza vaccine  administered  Encouraged to take Tylenol as needed for fever or muscle aches. - Flu Vaccine QUAD 6+ mos PF IM (Fluarix Quad PF)  8. Encounter for screening for metabolic disorder - Lipid panel  9. Encounter for hepatitis C screening test for low risk patient  Will check Hepatitis C screening due to recent recommendations to screen all adults 18 years and older - Hepatitis C antibody  10. History of COVID-19 - albuterol (VENTOLIN HFA) 108 (90 Base) MCG/ACT inhaler; Inhale 2 puffs  into the lungs every 6 (six) hours as needed for wheezing or shortness of breath.  Dispense: 18 g; Refill: 2  11. Encounter to establish care with new doctor     Patient was given opportunity to ask questions. Patient verbalized understanding of the plan and was able to repeat key elements of the plan. All questions were answered to their satisfaction.  Minette Brine, FNP   I, Minette Brine, FNP, have reviewed all documentation for this visit. The documentation on 08/29/20 for the exam, diagnosis, procedures, and orders are all accurate and complete.   THE PATIENT IS ENCOURAGED TO PRACTICE SOCIAL DISTANCING DUE TO THE COVID-19 PANDEMIC.

## 2020-08-26 LAB — LIPID PANEL
Chol/HDL Ratio: 2.8 ratio (ref 0.0–4.4)
Cholesterol, Total: 148 mg/dL (ref 100–199)
HDL: 52 mg/dL (ref >39)
LDL Chol Calc (NIH): 88 mg/dL (ref 0–99)
Triglycerides: 32 mg/dL (ref 0–149)
VLDL Cholesterol Cal: 8 mg/dL (ref 5–40)

## 2020-08-26 LAB — CBC
Hematocrit: 42 % (ref 34.0–46.6)
Hemoglobin: 13.3 g/dL (ref 11.1–15.9)
MCH: 26.6 pg (ref 26.6–33.0)
MCHC: 31.7 g/dL (ref 31.5–35.7)
MCV: 84 fL (ref 79–97)
Platelets: 215 10*3/uL (ref 150–450)
RBC: 5 x10E6/uL (ref 3.77–5.28)
RDW: 13.5 % (ref 11.7–15.4)
WBC: 4.4 10*3/uL (ref 3.4–10.8)

## 2020-08-26 LAB — LIPASE: Lipase: 30 U/L (ref 14–72)

## 2020-08-26 LAB — CMP14+EGFR
ALT: 13 IU/L (ref 0–32)
AST: 14 IU/L (ref 0–40)
Albumin/Globulin Ratio: 1.8 (ref 1.2–2.2)
Albumin: 4.4 g/dL (ref 3.9–5.0)
Alkaline Phosphatase: 89 IU/L (ref 44–121)
BUN/Creatinine Ratio: 18 (ref 9–23)
BUN: 12 mg/dL (ref 6–20)
Bilirubin Total: 0.5 mg/dL (ref 0.0–1.2)
CO2: 20 mmol/L (ref 20–29)
Calcium: 9.3 mg/dL (ref 8.7–10.2)
Chloride: 103 mmol/L (ref 96–106)
Creatinine, Ser: 0.68 mg/dL (ref 0.57–1.00)
Globulin, Total: 2.4 g/dL (ref 1.5–4.5)
Glucose: 86 mg/dL (ref 65–99)
Potassium: 4.5 mmol/L (ref 3.5–5.2)
Sodium: 139 mmol/L (ref 134–144)
Total Protein: 6.8 g/dL (ref 6.0–8.5)
eGFR: 127 mL/min/{1.73_m2} (ref >59)

## 2020-08-26 LAB — H PYLORI, IGM, IGG, IGA AB
H pylori, IgM Abs: <9 units (ref 0.0–8.9)
H. pylori, IgA Abs: <9 units (ref 0.0–8.9)
H. pylori, IgG AbS: 0.14 Index Value (ref 0.00–0.79)

## 2020-08-26 LAB — HEMOGLOBIN A1C
Est. average glucose Bld gHb Est-mCnc: 105 mg/dL
Hgb A1c MFr Bld: 5.3 % (ref 4.8–5.6)

## 2020-08-26 LAB — AMYLASE: Amylase: 104 U/L (ref 31–110)

## 2020-08-26 LAB — TSH: TSH: 1.2 u[IU]/mL (ref 0.450–4.500)

## 2020-08-26 LAB — HEPATITIS C ANTIBODY: Hep C Virus Ab: <0.1 s/co ratio (ref 0.0–0.9)

## 2020-08-29 ENCOUNTER — Other Ambulatory Visit: Payer: Self-pay | Admitting: Nurse Practitioner

## 2020-08-29 DIAGNOSIS — R110 Nausea: Secondary | ICD-10-CM

## 2020-09-15 ENCOUNTER — Other Ambulatory Visit: Payer: Medicaid Other

## 2020-09-24 ENCOUNTER — Ambulatory Visit: Payer: Medicaid Other | Admitting: Nurse Practitioner

## 2020-12-01 ENCOUNTER — Encounter: Payer: Medicaid Other | Admitting: Nurse Practitioner

## 2022-02-02 ENCOUNTER — Telehealth: Payer: Self-pay | Admitting: Nurse Practitioner

## 2022-02-02 DIAGNOSIS — M5441 Lumbago with sciatica, right side: Secondary | ICD-10-CM

## 2022-02-02 DIAGNOSIS — M5442 Lumbago with sciatica, left side: Secondary | ICD-10-CM

## 2022-02-03 MED ORDER — CYCLOBENZAPRINE HCL 10 MG PO TABS: 10.0000 mg | ORAL_TABLET | Freq: Three times a day (TID) | ORAL | 1 refills | Status: DC | PRN

## 2022-02-03 MED ORDER — NAPROXEN 500 MG PO TABS: 500.0000 mg | ORAL_TABLET | Freq: Two times a day (BID) | ORAL | 1 refills | Status: DC

## 2022-02-03 NOTE — Progress Notes (Signed)

## 2022-07-14 NOTE — Progress Notes (Signed)
 [facility] New Patient Evaluation  REFERRING PHYSICIAN:  Self  CHIEF COMPLAINT:  Chief Complaint  Patient presents with  . Annual Exam    AEX. Last Pap: 06/22/2022 +LSIL. (Was told by PCP cervical cancer. )  Desires STD testing. Tonya Harding, CMA     HISTORY OF PRESENT ILLNESS:  22 y.o. presents today for a second opinion due to abnormal pap smear.  She had a LSIL pap smear on 06/22/22.  Patient reports that she was told she has stage 1 cervical cancer and she wants further clarification on the diagnosis.  Additionally, she was diagnosed with chlamydia at her recent visit.  She reports completed course of Doxycycline two days ago.  Reports that partner is aware of diagnosis and she has not had intercourse since completing treatment.    GYN HISTORY:  Last menstrual period: Patient's last menstrual period was 07/01/2022 (approximate). Last Pap Smear:  LSIL 06/2022 History of Abnormal Pap Smear(s): Above, first pap smear  History of STIs: yes, CT in the past Contraceptive Method: DMPA  OB HISTORY:   OB History     Gravida  0   Para  0   Term  0   Preterm  0   AB  0   Living  0      SAB  0   IAB  0   Ectopic  0   Molar  0   Multiple  0   Live Births  0          PAST MEDICAL HISTORY:  Past Medical History:  Diagnosis Date  . Asthma, unspecified asthma severity, unspecified whether complicated, unspecified whether persistent   . Depression      PAST SURGICAL HISTORY: History reviewed. No pertinent surgical history.  MEDICATIONS:   Current Outpatient Medications:  .  albuterol  90 mcg/actuation inhaler, Inhale into the lungs, Disp: , Rfl:  .  cyclobenzaprine  (FLEXERIL ) 10 MG tablet, Take 10 mg by mouth 3 (three) times daily as needed, Disp: , Rfl:  .  doxycycline (VIBRAMYCIN) 100 MG capsule, Take 100 mg by mouth 2 (two) times daily, Disp: , Rfl:  .  medroxyPROGESTERone (DEPO-PROVERA) 150 mg/mL injection, Inject 1 mL (150 mg total) into the muscle  every 3 (three) months, Disp: 1 mL, Rfl: 3 .  naproxen  (NAPROSYN ) 500 MG tablet, Take by mouth, Disp: , Rfl:   ALLERGIES:  Allergies  Allergen Reactions  . Penicillins Other (See Comments)    REACTION: itching and ?rash    SOCIAL HISTORY:   Social History   Socioeconomic History  . Marital status: Single  Tobacco Use  . Smoking status: Every Day  . Smokeless tobacco: Never  . Tobacco comments:    uses tobacco leaves with marijuana an vapes  Vaping Use  . Vaping Use: Every day  Substance and Sexual Activity  . Alcohol use: Yes    Alcohol/week: 2.0 standard drinks of alcohol    Types: 2 Shots of liquor per week  . Drug use: Yes    Frequency: 20.0 times per week    Types: Marijuana  . Sexual activity: Yes    Partners: Male, Female    Birth control/protection: Condom, Injection    Comment: Both  Other Topics Concern  . Would you please tell us  about the people who live in your home, your pets, or anything else important to your social life? 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 Difficulty of Paying Living Expenses: Very hard  Food Insecurity: Food Insecurity Present (06/19/2022)   Hunger Vital Sign   . Worried About Programme Researcher, Broadcasting/film/video in the Last Year: Often true   . Ran Out of Food in the Last Year: Often true  Transportation Needs: Unmet Transportation Needs (06/19/2022)   PRAPARE - Transportation   . Lack of Transportation (Medical): Yes   . Lack of Transportation (Non-Medical): Yes  Housing Stability: High Risk (06/19/2022)   Housing Stability Vital Sign   . Unable to Pay for Housing in the Last Year: Yes   . Number of Places Lived in the Last Year: 2   . Unstable Housing in the Last Year: Yes    FAMILY  HISTORY:   Family History  Problem Relation Age of Onset  . Sleep apnea Mother   . No Known Problems Father   . Diabetes Sister   . Other Sister        prediabetes  . No Known Problems Sister   . Breast cancer Paternal Aunt   . Diabetes Maternal Grandmother   . Heart failure Maternal Grandmother   . Diabetes Paternal Grandmother   . Heart failure Paternal Grandmother   . Stroke Paternal Grandmother     REVIEW OF SYSTEMS:   All relevant review of systems negative unless specified in HPI.   PHYSICAL EXAMINATION:   VITAL SIGNS:        Vitals:   07/14/22 1607  BP: 98/62  Weight: 50.4 kg (111 lb 1.6 oz)  Height: 164 cm (5' 4.57)  PainSc: 0-No pain    GENERAL APPEARANCE:  In general, well-developed, well-nourished female in no acute distress.   Skin:                Warm and dry. Lungs:                             No increased work of breathing  Extremities:                     No cyanosis, clubbing or edema  PELVIC EXAMINATION:   External genitalia:  Normal female Urethra:  Normal Bladder:  Nontender Vagina:  Normal mucosa Cervix:  No lesions Anus and perineum: Normal   IMPRESSION:  23 y.o. who presents with LSIL pap smear.   PLAN:    Diagnoses and all orders for this visit:  LGSIL on Pap smear of cervix - Discussed LSIL pap smear and pathophysiology of cervical dysplasia.  Reassurance provided. - Recommend repeat pap smear in one year. - Confirmed HPV vaccine completion. - Patient currently uses tobacco paper.  Discussed impact of tobacco use on cervical dysplasia, encouraged cessation.   Routine screening for STI (sexually transmitted infection) -     Trichomonas Prep  History of chlamydia infection -     Chlamydia Trachomatis and Neisseria Gonorrhoeae, DNA Amplification; Future - Chlamydia test positive at recent PCP appointment.  Needs TORI in 4 weeks.   RTO in 4 wks for TORI, then can resume annual care.     Medication list reviewed with patient.   Issues concerning treatment and diagnosis were discussed with patient. There are no barriers to learning. Teaching/explanation well  received by patient and family/SO; understanding verbalized. Updated medication reconciliation list was reviewed and a copy was provided to the patient.  RICHALLE VICTORIA CLAUDENE ARCHER, MD

## 2022-08-11 NOTE — Progress Notes (Signed)
 Patient in for test of  cure . Urine received for Chlamydia toc.

## 2023-06-20 ENCOUNTER — Ambulatory Visit
Admission: EM | Admit: 2023-06-20 | Discharge: 2023-06-20 | Disposition: A | Discharge status: Home / Self Care | Payer: Medicaid Other | Attending: Family Medicine | Admitting: Family Medicine

## 2023-06-20 DIAGNOSIS — H1013 Acute atopic conjunctivitis, bilateral: Secondary | ICD-10-CM | POA: Diagnosis not present

## 2023-06-20 MED ORDER — OLOPATADINE HCL 0.1 % OP SOLN: 1.0000 [drp] | Freq: Two times a day (BID) | OPHTHALMIC | 0 refills | Status: DC

## 2023-06-20 MED ORDER — TOBRAMYCIN 0.3 % OP SOLN: 1.0000 [drp] | OPHTHALMIC | 0 refills | Status: DC

## 2023-06-20 NOTE — ED Triage Notes (Signed)
Pt reports eye contact left eye since 06/2022- eye contact right eye placed 08/2022-both contacts are intended to be changed monthly-pt c/o bilat eye burning x "months"-states she is unable to remove contacts-NAD-steady gait

## 2023-06-20 NOTE — Discharge Instructions (Addendum)
Start using daily olopatadine eye drops for the irritation of your eyes. Use tobramycin for 1 week to help prevent infection. Follow up with your eye doctor urgently to remove the contact lenses.

## 2023-06-20 NOTE — ED Provider Notes (Signed)
[facility]  Note:  This document was prepared using Conservation officer, historic buildings and may include unintentional dictation errors.  MRN: [id number] DOB: [birth year]  Subjective:   Tonya Harding is a 22 y.o. female presenting for persistent eye irritation, burning for the past few months.  Has been using monthly use contacts.  The ones she currently has on she has not replaced for about a year.  No eyelid pain, eye swelling, ulcerations, vision change.  She does have an eye specialist and plans on following up with them.  No current facility-administered medications for this encounter.  Current Outpatient Medications:    albuterol (VENTOLIN HFA) 108 (90 Base) MCG/ACT inhaler, Inhale 2 puffs into the lungs every 6 (six) hours as needed for wheezing or shortness of breath., Disp: 18 g, Rfl: 2   cyclobenzaprine (FLEXERIL) 10 MG tablet, Take 1 tablet (10 mg total) by mouth 3 (three) times daily as needed for muscle spasms., Disp: 30 tablet, Rfl: 1   naproxen (NAPROSYN) 500 MG tablet, Take 1 tablet (500 mg total) by mouth 2 (two) times daily with a meal., Disp: 60 tablet, Rfl: 1   Allergies  Allergen Reactions   Penicillins     REACTION: itching and ?rash    Past Medical History:  Diagnosis Date   Asthma    Eczema      History reviewed. No pertinent surgical history.  Family History  Problem Relation Age of Onset   Sleep apnea Mother    Polycystic ovary syndrome Mother    Diabetes Sister        23yo   Thyroid disease Sister    Polycystic ovary syndrome Sister    Heart failure Maternal Grandmother    Hypertension Maternal Grandmother    Hyperlipidemia Maternal Grandmother    Diabetes Maternal Grandmother    Diabetes Paternal Grandmother    Stroke Paternal Grandmother    Hyperlipidemia Paternal Grandmother     Social History   Tobacco Use   Smoking status: Never   Smokeless tobacco: Never  Vaping Use   Vaping status: Every Day  Substance Use  Topics   Alcohol use: Yes    Comment: occ   Drug use: Yes    Types: Marijuana    ROS   Objective:   Vitals: BP 123/70 (BP Location: Right Arm)   Pulse 95   Temp 99 F (37.2 C) (Oral)   Resp 16   LMP 04/25/2023   SpO2 98%   Physical Exam Constitutional:      General: She is not in acute distress.    Appearance: Normal appearance. She is well-developed. She is not ill-appearing, toxic-appearing or diaphoretic.  HENT:     Head: Normocephalic and atraumatic.     Nose: Nose normal.     Mouth/Throat:     Mouth: Mucous membranes are moist.  Eyes:     General: Lids are normal. Lids are everted, no foreign bodies appreciated. Vision grossly intact. No scleral icterus.       Right eye: No foreign body, discharge or hordeolum.        Left eye: No foreign body, discharge or hordeolum.     Extraocular Movements: Extraocular movements intact.     Right eye: Normal extraocular motion.     Left eye: Normal extraocular motion and no nystagmus.     Conjunctiva/sclera:     Right eye: Right conjunctiva is not injected. No chemosis, exudate or hemorrhage.    Left eye: Left conjunctiva  is injected. No chemosis, exudate or hemorrhage.  Cardiovascular:     Rate and Rhythm: Normal rate.  Pulmonary:     Effort: Pulmonary effort is normal.  Skin:    General: Skin is warm and dry.  Neurological:     General: No focal deficit present.     Mental Status: She is alert and oriented to person, place, and time.  Psychiatric:        Mood and Affect: Mood normal.        Behavior: Behavior normal.     Assessment and Plan :   PDMP not reviewed this encounter.  1. Allergic conjunctivitis of both eyes    Emphasized need for follow-up with her eye specialist.  I cannot adequately or safely remove these without risking significant injury.  Recommended starting olopatadine, use tobramycin as a prevention.  Counseled patient on potential for adverse effects with medications prescribed/recommended  today, ER and return-to-clinic precautions discussed, patient verbalized understanding.    Wallis Bamberg, [place] 06/20/23 6301

## 2024-02-26 NOTE — ED Triage Notes (Signed)
 Patient presents this evening with vaginal itching that has progressively worsened and has noticed redness and burning in the area. Notes a new discharge with odor that began yesterday. Requesting to have STI workup. Is unsure if she could be pregnant.

## 2024-02-26 NOTE — ED Notes (Signed)
 Unable to locate patient / patient has not responded to three call attempts. Patient removed from Track Board and dispositioned as LWBS.

## 2024-02-26 NOTE — ED Provider Notes (Signed)
 Advanced Practice Provider (APP) Emergency Department AMA/LWBS Disposition Review  ED Visit: 02/26/2024  Record Reviewed: 02/26/2024 9:39 AM.   Abnormal Findings/Reason for Follow-up:  Patient presented for vaginal itching, redness, and burning as well as new vaginal discharge with odor, unsure if could be pregnant, no labs performed  Follow-up:  Follow up with PCP for further workup given symptoms  ED Resource RN notified to contact patient/guardian regarding abnormal findings.   Malican, Darcy Laine, [place] 02/26/24 [phone]

## 2024-02-28 NOTE — Progress Notes (Signed)
 Emergency Department (ED) Resource Nursing Documentation of LWBS/AMA Disposition  Encounter Date: 02/26/2024  LWBS: Chart reviewed by PA Malican, who recommended a well check and pt f/u with PCP. I spoke with the pt who sts she figured out what the problem was. She was advised to return if need be. Appreciation for the call expressed. No further resource needs at this time.

## 2024-04-29 ENCOUNTER — Other Ambulatory Visit: Payer: Self-pay

## 2024-04-29 ENCOUNTER — Encounter (HOSPITAL_COMMUNITY): Payer: Self-pay | Admitting: *Deleted

## 2024-04-29 ENCOUNTER — Emergency Department (HOSPITAL_COMMUNITY)

## 2024-04-29 ENCOUNTER — Emergency Department (HOSPITAL_COMMUNITY)
Admission: EM | Admit: 2024-04-29 | Discharge: 2024-04-29 | Disposition: A | Discharge status: Home / Self Care | Attending: Emergency Medicine | Admitting: Emergency Medicine

## 2024-04-29 DIAGNOSIS — Z3A11 11 weeks gestation of pregnancy: Secondary | ICD-10-CM | POA: Diagnosis not present

## 2024-04-29 DIAGNOSIS — D369 Benign neoplasm, unspecified site: Secondary | ICD-10-CM

## 2024-04-29 DIAGNOSIS — D271 Benign neoplasm of left ovary: Secondary | ICD-10-CM | POA: Insufficient documentation

## 2024-04-29 DIAGNOSIS — O99892 Other specified diseases and conditions complicating childbirth: Secondary | ICD-10-CM | POA: Insufficient documentation

## 2024-04-29 DIAGNOSIS — B9689 Other specified bacterial agents as the cause of diseases classified elsewhere: Secondary | ICD-10-CM | POA: Insufficient documentation

## 2024-04-29 DIAGNOSIS — O23591 Infection of other part of genital tract in pregnancy, first trimester: Secondary | ICD-10-CM | POA: Insufficient documentation

## 2024-04-29 DIAGNOSIS — O26891 Other specified pregnancy related conditions, first trimester: Secondary | ICD-10-CM | POA: Diagnosis present

## 2024-04-29 LAB — CBC
HCT: 34.5 % — ABNORMAL LOW (ref 36.0–46.0)
Hemoglobin: 11.3 g/dL — ABNORMAL LOW (ref 12.0–15.0)
MCH: 26.7 pg (ref 26.0–34.0)
MCHC: 32.8 g/dL (ref 30.0–36.0)
MCV: 81.6 fL (ref 80.0–100.0)
Platelets: 348 K/uL (ref 150–400)
RBC: 4.23 MIL/uL (ref 3.87–5.11)
RDW: 12.3 % (ref 11.5–15.5)
WBC: 10.2 K/uL (ref 4.0–10.5)
nRBC: 0 % (ref 0.0–0.2)

## 2024-04-29 LAB — URINALYSIS, ROUTINE W REFLEX MICROSCOPIC
Bilirubin Urine: NEGATIVE
Glucose, UA: NEGATIVE mg/dL
Hgb urine dipstick: NEGATIVE
Ketones, ur: NEGATIVE mg/dL
Leukocytes,Ua: NEGATIVE
Nitrite: NEGATIVE
Protein, ur: NEGATIVE mg/dL
Specific Gravity, Urine: 1.026 (ref 1.005–1.030)
pH: 5 (ref 5.0–8.0)

## 2024-04-29 LAB — COMPREHENSIVE METABOLIC PANEL WITH GFR
ALT: 13 U/L (ref 0–44)
AST: 18 U/L (ref 15–41)
Albumin: 2.8 g/dL — ABNORMAL LOW (ref 3.5–5.0)
Alkaline Phosphatase: 68 U/L (ref 38–126)
Anion gap: 13 (ref 5–15)
BUN: 11 mg/dL (ref 6–20)
CO2: 24 mmol/L (ref 22–32)
Calcium: 8.8 mg/dL — ABNORMAL LOW (ref 8.9–10.3)
Chloride: 101 mmol/L (ref 98–111)
Creatinine, Ser: 0.64 mg/dL (ref 0.44–1.00)
GFR, Estimated: >60 mL/min (ref >60)
Glucose, Bld: 90 mg/dL (ref 70–99)
Potassium: 4.2 mmol/L (ref 3.5–5.1)
Sodium: 138 mmol/L (ref 135–145)
Total Bilirubin: 0.3 mg/dL (ref 0.0–1.2)
Total Protein: 7.1 g/dL (ref 6.5–8.1)

## 2024-04-29 LAB — SAMPLE TO BLOOD BANK

## 2024-04-29 LAB — WET PREP, GENITAL
Sperm: NONE SEEN
Trich, Wet Prep: NONE SEEN
WBC, Wet Prep HPF POC: >=10 — AB (ref <10)
Yeast Wet Prep HPF POC: NONE SEEN

## 2024-04-29 LAB — HCG, QUANTITATIVE, PREGNANCY: hCG, Beta Chain, Quant, S: 33 m[IU]/mL — ABNORMAL HIGH (ref <5)

## 2024-04-29 LAB — HCG, SERUM, QUALITATIVE: Preg, Serum: POSITIVE — AB

## 2024-04-29 LAB — HIV ANTIBODY (ROUTINE TESTING W REFLEX): HIV Screen 4th Generation wRfx: NONREACTIVE

## 2024-04-29 LAB — LIPASE, BLOOD: Lipase: 24 U/L (ref 11–51)

## 2024-04-29 MED ORDER — SODIUM CHLORIDE 0.9 % IV BOLUS
1000.0000 mL | Freq: Once | INTRAVENOUS | Status: AC
  Administered 2024-04-29: 1000 mL via INTRAVENOUS

## 2024-04-29 MED ORDER — OXYCODONE HCL 5 MG PO TABS: 5.0000 mg | ORAL_TABLET | ORAL | 0 refills | Status: DC | PRN

## 2024-04-29 MED ORDER — METRONIDAZOLE 500 MG PO TABS
500.0000 mg | ORAL_TABLET | Freq: Once | ORAL | Status: AC
  Administered 2024-04-29: 500 mg via ORAL
  Filled 2024-04-29: qty 1

## 2024-04-29 MED ORDER — MORPHINE SULFATE (PF) 4 MG/ML IV SOLN
4.0000 mg | Freq: Once | INTRAVENOUS | Status: AC
  Administered 2024-04-29: 4 mg via INTRAVENOUS
  Filled 2024-04-29: qty 1

## 2024-04-29 MED ORDER — METRONIDAZOLE 500 MG PO TABS: 500.0000 mg | ORAL_TABLET | Freq: Two times a day (BID) | ORAL | 0 refills | Status: DC

## 2024-04-29 MED ORDER — ONDANSETRON HCL 4 MG/2ML IJ SOLN
4.0000 mg | Freq: Once | INTRAMUSCULAR | Status: AC
  Administered 2024-04-29: 4 mg via INTRAVENOUS
  Filled 2024-04-29: qty 2

## 2024-04-29 NOTE — Discharge Instructions (Addendum)
 As discussed, you need to call the gynecology office tomorrow morning for a follow-up appointment in the next 48 to 72 hours.  Please tell them that you were seen in the emergency room need to follow-up with them.  Please seek emergency care at the maternal emergency room here at [facility] if experiencing any new or worsening symptoms such as fever or severe vaginal bleeding.  Alternating between 650 mg Tylenol  and 400 mg Advil : The best way to alternate taking Acetaminophen  (example Tylenol ) and Ibuprofen  (example Advil /Motrin ) is to take them 3 hours apart. For example, if you take ibuprofen  at 6 am you can then take Tylenol  at 9 am. You can continue this regimen throughout the day, making sure you do not exceed the recommended maximum dose for each drug.

## 2024-04-29 NOTE — ED Provider Notes (Signed)
 [facility] AT [facility] Provider Note   CSN: [id number] Arrival date & time: 04/29/24  1732     Patient presents with: Abdominal Pain and Vaginal Bleeding   Tonya Harding is a 23 y.o. female with PMHx asthma, eczema who presents to ED concerned for LLQ abdominal pain progressively worsening over the past 2 weeks. Patient stating that [facility] Patient was [redacted] weeks pregnant at that time and states that it was an IUP.  Patient was healing well  until the pain started 2 weeks ago. Patient also endorsing 2 episodes of non-bloody emesis over the past 1 week. Also endorses intermittent episodes of non-bloody diarrhea. Also endorses nausea when pain becomes severe. Also endorses vaginal yeast infection (thick, yellow discharge) which was present prior to abortion and has not yet resolved despite being on Monistat. Denies fever. Patient does also endorse light and intermittent vaginal bleeding over the past couple of days.  Patient has not had sexual intercourse since abortion.    Abdominal Pain Associated symptoms: vaginal bleeding   Vaginal Bleeding Associated symptoms: abdominal pain        Prior to Admission medications   Medication Sig Start Date End Date Taking? Authorizing Provider  metroNIDAZOLE (FLAGYL) 500 MG tablet Take 1 tablet (500 mg total) by mouth 2 (two) times daily. 04/29/24  Yes Hoy Fraction F, PA-C  albuterol  (VENTOLIN  HFA) 108 (90 Base) MCG/ACT inhaler Inhale 2 puffs into the lungs every 6 (six) hours as needed for wheezing or shortness of breath. 08/25/20   Moore, Janece, FNP  cyclobenzaprine  (FLEXERIL ) 10 MG tablet Take 1 tablet (10 mg total) by mouth 3 (three) times daily as needed for muscle spasms. 02/03/22   Gladis Mustard, FNP  naproxen  (NAPROSYN ) 500 MG tablet Take 1 tablet (500 mg total) by mouth 2 (two) times daily with a meal. 02/03/22   Gladis, Mary-Margaret, FNP   olopatadine  (PATANOL) 0.1 % ophthalmic solution Place 1 drop into both eyes 2 (two) times daily. 06/20/23   Christopher Letticia Bhattacharyya, PA-C  oxyCODONE (ROXICODONE) 5 MG immediate release tablet Take 1 tablet (5 mg total) by mouth every 4 (four) hours as needed for up to 5 doses. 04/29/24   Hoy Fraction FALCON, PA-C  tobramycin  (TOBREX ) 0.3 % ophthalmic solution Place 1 drop into both eyes every 4 (four) hours. 06/20/23   Christopher Mashal Slavick, PA-C    Allergies: Penicillins    Review of Systems  Gastrointestinal:  Positive for abdominal pain.  Genitourinary:  Positive for vaginal bleeding.    Updated Vital Signs BP 114/63 (BP Location: Right Arm)   Pulse 96   Temp 97.7 F (36.5 C) (Oral)   Resp 18   Ht 5' 5 (1.651 m)   Wt 53 kg   LMP 04/22/2024   SpO2 100%   BMI 19.44 kg/m   Physical Exam Vitals and nursing note reviewed. Exam conducted with a chaperone present.  Constitutional:      General: She is not in acute distress.    Appearance: She is not ill-appearing or toxic-appearing.  HENT:     Head: Normocephalic and atraumatic.     Mouth/Throat:     Mouth: Mucous membranes are moist.     Pharynx: No posterior oropharyngeal erythema.  Eyes:     General: No scleral icterus.       Right eye: No discharge.        Left eye: No discharge.     Conjunctiva/sclera:  Conjunctivae normal.  Cardiovascular:     Rate and Rhythm: Normal rate and regular rhythm.     Pulses: Normal pulses.     Heart sounds: Normal heart sounds. No murmur heard. Pulmonary:     Effort: Pulmonary effort is normal. No respiratory distress.     Breath sounds: Normal breath sounds. No wheezing, rhonchi or rales.  Abdominal:     General: Abdomen is flat. Bowel sounds are normal. There is no distension.     Palpations: Abdomen is soft.     Tenderness: There is generalized abdominal tenderness.  Genitourinary:    Comments: RN Lang chaperoning pelvic exam: - External: without ulcers, lesions, or lacerations - Vaginal Vault:  without laceration, pooled blood, clots, foreign body - Cervix: without discoloration, friability, or masses - Os: closed without blood. There is copious yellowish/white thick discharge present.  Musculoskeletal:     Right lower leg: No edema.     Left lower leg: No edema.  Skin:    General: Skin is warm and dry.     Findings: No rash.  Neurological:     General: No focal deficit present.     Mental Status: She is alert and oriented to person, place, and time. Mental status is at baseline.  Psychiatric:        Mood and Affect: Mood normal.        Behavior: Behavior normal.     (all labs ordered are listed, but only abnormal results are displayed) Labs Reviewed  WET PREP, GENITAL - Abnormal; Notable for the following components:      Result Value   Clue Cells Wet Prep HPF POC PRESENT (*)    WBC, Wet Prep HPF POC >=10 (*)    All other components within normal limits  COMPREHENSIVE METABOLIC PANEL WITH GFR - Abnormal; Notable for the following components:   Calcium 8.8 (*)    Albumin 2.8 (*)    All other components within normal limits  CBC - Abnormal; Notable for the following components:   Hemoglobin 11.3 (*)    HCT 34.5 (*)    All other components within normal limits  URINALYSIS, ROUTINE W REFLEX MICROSCOPIC - Abnormal; Notable for the following components:   APPearance HAZY (*)    All other components within normal limits  HCG, SERUM, QUALITATIVE - Abnormal; Notable for the following components:   Preg, Serum POSITIVE (*)    All other components within normal limits  HCG, QUANTITATIVE, PREGNANCY - Abnormal; Notable for the following components:   hCG, Beta Chain, Quant, S 33 (*)    All other components within normal limits  LIPASE, BLOOD  HIV ANTIBODY (ROUTINE TESTING W REFLEX)  RPR  SAMPLE TO BLOOD BANK  GC/CHLAMYDIA PROBE AMP ([place]) NOT AT [facility]    EKG: None  Radiology: US  PELVIC COMPLETE W TRANSVAGINAL AND TORSION R/O Result Date: 04/29/2024 EXAM: US   Pelvis, Complete Transvaginal and Transabdominal with Doppler 04/29/2024 10:42:57 PM TECHNIQUE: Transabdominal and transvaginal pelvic duplex ultrasound using B-mode/gray scaled imaging with Doppler spectral analysis and color flow was obtained. COMPARISON: None available CLINICAL HISTORY: [id number] Pelvic pain [id number]; [id number] Vaginal bleeding [id number] FINDINGS: UTERUS: Uterus measures 7.1 x 4.0 x 5.1 cm. Volume 76 ml. No uterine mass is seen. Uterus demonstrates normal myometrial echotexture. ENDOMETRIAL STRIPE: Endometrium measures 11.5 mm and is heterogeneous. These changes may simply be related to the prior pregnancy. Slight increased vascularity is noted, and it would be difficult to exclude retained products of conception given the known positive  beta hCG level. Correlate with any recent sexual activity. RIGHT OVARY: Right ovary measures 3.9 x 2.0 x 2.5 cm with a volume of 10 ml. No Ovarian abnormality on the right is seen. Normal arterial and venous flow is noted in the right ovary. LEFT OVARY: The left ovary is partially visualized with follicular changes. Adjacent to the left ovary, there is a 7 cm complex mass identified. Some areas of increased echogenicity are noted raising suspicion for possible dermoid. Non emergent MR may be helpful for further evaluation. FREE FLUID: No free fluid. IMPRESSION: 1. Heterogeneous, vascular endometrium measuring 11.5 mm with retained products of conception not excluded given positive beta hCG; recommend obstetric/gynecologic evaluation and consideration of short-interval follow-up ultrasound or uterine evacuation as clinically indicated. 2. Complex 7 cm adnexal mass adjacent to the left ovary with echogenic components suspicious for dermoid; recommend non-emergent pelvic MRI with IV contrast or gynecologic surgical evaluation for further characterization and management. Electronically signed by: Oneil Devonshire MD 04/29/2024 10:53 PM EST RP Workstation: [id number]      Procedures   Medications Ordered in the ED  metroNIDAZOLE (FLAGYL) tablet 500 mg (has no administration in time range)  morphine (PF) 4 MG/ML injection 4 mg (4 mg Intravenous Given 04/29/24 2108)  ondansetron  (ZOFRAN ) injection 4 mg (4 mg Intravenous Given 04/29/24 2107)  sodium chloride  0.9 % bolus 1,000 mL (0 mLs Intravenous Stopped 04/29/24 2251)                                    Medical Decision Making Amount and/or Complexity of Data Reviewed Labs: ordered. Radiology: ordered.  Risk Prescription drug management.    This patient presents to the ED for concern of abdominal pain, this involves an extensive number of treatment options, and is a complaint that carries with it a high risk of complications and morbidity.  The differential diagnosis includes gastroenteritis, colitis, small bowel obstruction, appendicitis, cholecystitis, pancreatitis, nephrolithiasis, UTI, pyleonephritis, ruptured ectopic pregnancy, PID, ovarian torsion.   Co morbidities that complicate the patient evaluation   asthma, eczema, recent abortion   Additional history obtained:  Prior imaging, lab results, and notes reviewed.   Problem List / ED Course / Critical interventions / Medication management  Patient presented for LLQ abdominal pain.  Has intermittent and very mild vaginal bleeding once daily which is only noticed a little bit when she wipes.  Physical exam with generalized abdominal pain that seems to be more severe in the LLQ.  There is also yellowish/white discharge on pelvic exam without cervical motion tenderness.  Rest of physical exam reassuring.  Patient afebrile with stable vitals. I Ordered, and personally interpreted labs.  hCG positive at 33.  CBC without leukocytosis.  There is mild anemia with hemoglobin 11.3.  CMP reassuring.  Lipase within normal limits.  UA not concerning for infection.  Wet prep positive for clue cells.  Rest of STI panel pending. I ordered imaging  studies including Pelvic US . I independently visualized and interpreted imaging and I agree with the radiologist interpretation of possible retained products of conception and dermoid cyst of left ovary. I requested consultation with the OB/GYN provider on-call Dr. Fredirick,  and discussed lab and imaging findings as well as pertinent plan - they stated that ultrasound does not look concerning for retained products.  Patient will need to have close follow-up with her outpatient OB/GYN for her dermoid cyst and positive pregnancy test. Overall, I  agree that retained products is less likely as patient does not have leukocytosis, fever, and rest of vitals are reassuring. Patient without sexual intercourse since abortion - positive pregnancy test seems to be from downtrending hCG rather than new pregnancy.  Patient agreeing and stating that she is very sure that she is not newly pregnant. Shared all results with patient.  Answered all questions.  Patient agrees to call gynecology first thing in the morning for a quick follow-up appointment.  Will start patient on Flagyl for BV.  Educated patient on alternating Tylenol  and Advil  for pain control.  Will prescribe the patient a couple doses of Oxy 5 mg for breakthrough pain. I have reviewed the patients home medicines and have made adjustments as needed The patient has been appropriately medically screened and/or stabilized in the ED. I have low suspicion for any other emergent medical condition which would require further screening, evaluation or treatment in the ED or require inpatient management. At time of discharge the patient is hemodynamically stable and in no acute distress. I have discussed work-up results and diagnosis with patient and answered all questions. Patient is agreeable with discharge plan. We discussed strict return precautions for returning to the emergency department and they verbalized understanding.     Social Determinants of  Health:  none       Final diagnoses:  BV (bacterial vaginosis)  Dermoid cyst    ED Discharge Orders          Ordered    metroNIDAZOLE (FLAGYL) 500 MG tablet  2 times daily        04/29/24 2323    oxyCODONE (ROXICODONE) 5 MG immediate release tablet  Every 4 hours PRN,   Status:  Discontinued        04/29/24 2323    oxyCODONE (ROXICODONE) 5 MG immediate release tablet  Every 4 hours PRN       Note to Pharmacy: Dermoid cyst ICD 10 code: D27.0   04/29/24 2329               Hoy Nidia FALCON, PA-C 04/29/24 2335    Francesca Elsie CROME, MD 05/02/24 [phone]

## 2024-04-29 NOTE — ED Notes (Addendum)
 Pt reports having an abortion about 1 month ago, was app 11 weeks. Prior pt had symptoms of a yeast infection (thick, yellow, foul discharge), used otc monistat. Pt endorses dull ache on left side but worsens while lying on right side. Complains of pain with bowel movements, urinating, or passing gas. Pt denies taking any birth control or new medications. Denies any clots.

## 2024-04-29 NOTE — ED Triage Notes (Signed)
 Pt c/o LLQ abd pain and vaginal bleeding increased over the last week. Pt states that she had an abortion approximately one month ago.

## 2024-04-29 NOTE — Progress Notes (Signed)
 Patient ID: Olena L Civil, female   DOB: [birth year], 23 y.o.   MRN: [id number]   OB/GYN Telephone Consult  04/29/2024   Shekera L Taira is a 23 y.o. No obstetric history on file. who is currently pregnant presenting to [facility] ER.   I was called for a consult regarding the care of this patient by the PA caring for the patient.   The provider had the following clinical question: Had TAB 1 month ago.  Still with irregular spotting Also with vaginal discharge, not relieved by Monistat  The provider presented the following relevant clinical information: U/s show 11 mm endometrial stripe Hgb is 11.3 7 cm dermoid on left ovary BV on wet prep  I performed a chart review on the patient and reviewed available documentation.  BP 109/69   Pulse 92   Temp 98.4 F (36.9 C)   Resp 17   Ht 5' 5 (1.651 m)   Wt 53 kg   LMP 04/22/2024   SpO2 100%   BMI 19.44 kg/m   Exam- performed by consulting provider   Recommendations:  -Unclear if this is a new pregnancy -repeat HCG in 48 hours -Treat BV -f/u primary OB/GYN or GYN of choice for treatment of dermoid, f/u HCG and LGSIL on pap.  -Recommended MD/APP provide the patient with a referral to the Center for [facility] (any office) or her primary GYN (with [facility], last seen 06/2022) for follow up in  2 days for repeat HCG.   Thank you for this consult and if additional recommendations are needed please call [phone] for the OB/GYN attending on service at [facility].   I spent approximately 10 minutes directly consulting with the provider and verbally discussing this case. Additionally 10 minutes minutes was spent performing chart review and documentation.    Glenys GORMAN Birk, MD

## 2024-04-30 LAB — RPR: RPR Ser Ql: NONREACTIVE

## 2024-04-30 LAB — GC/CHLAMYDIA PROBE AMP (~~LOC~~) NOT AT ARMC
Chlamydia: NEGATIVE
Comment: NEGATIVE
Comment: NORMAL
Neisseria Gonorrhea: NEGATIVE

## 2024-05-11 ENCOUNTER — Inpatient Hospital Stay (HOSPITAL_COMMUNITY)

## 2024-05-11 ENCOUNTER — Encounter (HOSPITAL_COMMUNITY): Payer: Self-pay

## 2024-05-11 ENCOUNTER — Inpatient Hospital Stay (EMERGENCY_DEPARTMENT_HOSPITAL)
Admission: AD | Admit: 2024-05-11 | Discharge: 2024-05-11 | Disposition: A | Discharge status: Home / Self Care | Source: Home / Self Care | Attending: Obstetrics and Gynecology | Admitting: Obstetrics and Gynecology

## 2024-05-11 DIAGNOSIS — D369 Benign neoplasm, unspecified site: Secondary | ICD-10-CM

## 2024-05-11 DIAGNOSIS — R102 Pelvic and perineal pain unspecified side: Secondary | ICD-10-CM | POA: Insufficient documentation

## 2024-05-11 DIAGNOSIS — D271 Benign neoplasm of left ovary: Secondary | ICD-10-CM | POA: Diagnosis not present

## 2024-05-11 DIAGNOSIS — J45909 Unspecified asthma, uncomplicated: Secondary | ICD-10-CM | POA: Diagnosis not present

## 2024-05-11 DIAGNOSIS — O26891 Other specified pregnancy related conditions, first trimester: Secondary | ICD-10-CM | POA: Insufficient documentation

## 2024-05-11 DIAGNOSIS — N921 Excessive and frequent menstruation with irregular cycle: Secondary | ICD-10-CM | POA: Insufficient documentation

## 2024-05-11 DIAGNOSIS — Z3A11 11 weeks gestation of pregnancy: Secondary | ICD-10-CM | POA: Insufficient documentation

## 2024-05-11 DIAGNOSIS — R1032 Left lower quadrant pain: Secondary | ICD-10-CM | POA: Diagnosis not present

## 2024-05-11 DIAGNOSIS — Z79899 Other long term (current) drug therapy: Secondary | ICD-10-CM | POA: Insufficient documentation

## 2024-05-11 DIAGNOSIS — O3481 Maternal care for other abnormalities of pelvic organs, first trimester: Secondary | ICD-10-CM | POA: Insufficient documentation

## 2024-05-11 DIAGNOSIS — Z789 Other specified health status: Secondary | ICD-10-CM

## 2024-05-11 LAB — COMPREHENSIVE METABOLIC PANEL WITH GFR
ALT: 11 U/L (ref 0–44)
AST: 14 U/L — ABNORMAL LOW (ref 15–41)
Albumin: 2.8 g/dL — ABNORMAL LOW (ref 3.5–5.0)
Alkaline Phosphatase: 72 U/L (ref 38–126)
Anion gap: 9 (ref 5–15)
BUN: 9 mg/dL (ref 6–20)
CO2: 27 mmol/L (ref 22–32)
Calcium: 8.7 mg/dL — ABNORMAL LOW (ref 8.9–10.3)
Chloride: 101 mmol/L (ref 98–111)
Creatinine, Ser: 0.54 mg/dL (ref 0.44–1.00)
GFR, Estimated: >60 mL/min (ref >60)
Glucose, Bld: 76 mg/dL (ref 70–99)
Potassium: 4.1 mmol/L (ref 3.5–5.1)
Sodium: 137 mmol/L (ref 135–145)
Total Bilirubin: 0.5 mg/dL (ref 0.0–1.2)
Total Protein: 7.3 g/dL (ref 6.5–8.1)

## 2024-05-11 LAB — URINALYSIS, ROUTINE W REFLEX MICROSCOPIC
Bilirubin Urine: NEGATIVE
Glucose, UA: NEGATIVE mg/dL
Hgb urine dipstick: NEGATIVE
Ketones, ur: NEGATIVE mg/dL
Leukocytes,Ua: NEGATIVE
Nitrite: NEGATIVE
Protein, ur: NEGATIVE mg/dL
Specific Gravity, Urine: 1.026 (ref 1.005–1.030)
pH: 6 (ref 5.0–8.0)

## 2024-05-11 LAB — CBC
HCT: 33.7 % — ABNORMAL LOW (ref 36.0–46.0)
Hemoglobin: 11 g/dL — ABNORMAL LOW (ref 12.0–15.0)
MCH: 26.2 pg (ref 26.0–34.0)
MCHC: 32.6 g/dL (ref 30.0–36.0)
MCV: 80.2 fL (ref 80.0–100.0)
Platelets: 308 K/uL (ref 150–400)
RBC: 4.2 MIL/uL (ref 3.87–5.11)
RDW: 12.6 % (ref 11.5–15.5)
WBC: 9.3 K/uL (ref 4.0–10.5)
nRBC: 0 % (ref 0.0–0.2)

## 2024-05-11 LAB — POCT PREGNANCY, URINE: Preg Test, Ur: NEGATIVE

## 2024-05-11 MED ORDER — IBUPROFEN 600 MG PO TABS: 600.0000 mg | ORAL_TABLET | Freq: Four times a day (QID) | ORAL | 0 refills | Status: DC | PRN

## 2024-05-11 MED ORDER — IBUPROFEN 600 MG PO TABS: 600.0000 mg | ORAL_TABLET | Freq: Once | ORAL | Status: DC

## 2024-05-11 NOTE — MAU Provider Note (Cosign Needed Addendum)
 History     CSN: [id number]  Arrival date and time: 05/11/24 9382   Event Date/Time   First Provider Initiated Contact with Patient 05/11/24 [phone]      Chief Complaint  Patient presents with   Abdominal Pain   HPI [name] is a 23 y.o. year old [id number] female s/p surgical EAB who presents to MAU reporting lower LT pelvic pain.  She describes the pain as aching on the left lower side and constant.  She also describes intermittent sharp pain in her vagina and lower left abdomen.  She reports her bladder always feeling full; using the bathroom a little bit.  Her surgical EAB was performed on 03/31/2024 at [facility] in [place].  She was [redacted] weeks gestation.  She denies any sexual intercourse since the procedure.  Her last menstrual period was 04/03/2024.  She was seen at [facility] on 04/29/2024 for LLQ pain and was diagnosed with a 7 cm left dermoid cyst.  She was advised at that time to follow-up at MAU if the pain worsened.  She reports the pain is now worse and unrelieved with medications.  She is scheduled [facility]; next appointment is 07/05/2024.  OB History     Gravida  1   Para      Term      Preterm      AB  1   Living         SAB      IAB  1   Ectopic      Multiple      Live Births              Past Medical History:  Diagnosis Date   Asthma    Eczema     History reviewed. No pertinent surgical history.  Family History  Problem Relation Age of Onset   Sleep apnea Mother    Polycystic ovary syndrome Mother    Diabetes Sister        23yo   Thyroid disease Sister    Polycystic ovary syndrome Sister    Heart failure Maternal Grandmother    Hypertension Maternal Grandmother    Hyperlipidemia Maternal Grandmother    Diabetes Maternal Grandmother    Diabetes Paternal Grandmother    Stroke Paternal Grandmother    Hyperlipidemia Paternal Grandmother     Social History   Tobacco Use   Smoking status:  Never   Smokeless tobacco: Never  Vaping Use   Vaping status: Every Day  Substance Use Topics   Alcohol use: Yes    Comment: occ   Drug use: Yes    Types: Marijuana    Allergies:  Allergies  Allergen Reactions   Penicillins     REACTION: itching and ?rash    Medications Prior to Admission  Medication Sig Dispense Refill Last Dose/Taking   metroNIDAZOLE  (FLAGYL ) 500 MG tablet Take 1 tablet (500 mg total) by mouth 2 (two) times daily. 14 tablet 0 Past Week   albuterol  (VENTOLIN  HFA) 108 (90 Base) MCG/ACT inhaler Inhale 2 puffs into the lungs every 6 (six) hours as needed for wheezing or shortness of breath. 18 g 2    cyclobenzaprine  (FLEXERIL ) 10 MG tablet Take 1 tablet (10 mg total) by mouth 3 (three) times daily as needed for muscle spasms. 30 tablet 1    naproxen  (NAPROSYN ) 500 MG tablet Take 1 tablet (500 mg total) by mouth 2 (two) times daily with a meal. 60 tablet  1    olopatadine  (PATANOL) 0.1 % ophthalmic solution Place 1 drop into both eyes 2 (two) times daily. 5 mL 0    oxyCODONE  (ROXICODONE ) 5 MG immediate release tablet Take 1 tablet (5 mg total) by mouth every 4 (four) hours as needed for up to 5 doses. 5 tablet 0    tobramycin  (TOBREX ) 0.3 % ophthalmic solution Place 1 drop into both eyes every 4 (four) hours. 5 mL 0     Review of Systems  Constitutional: Negative.   HENT: Negative.    Eyes: Negative.   Respiratory: Negative.    Cardiovascular: Negative.   Gastrointestinal: Negative.   Endocrine: Negative.   Genitourinary:  Positive for decreased urine volume, dysuria and pelvic pain (lower LT).  Musculoskeletal: Negative.   Skin: Negative.   Allergic/Immunologic: Negative.   Neurological: Negative.   Hematological: Negative.   Psychiatric/Behavioral: Negative.     Physical Exam   Blood pressure 111/83, pulse 95, temperature 98.5 F (36.9 C), temperature source Oral, resp. rate 19, height 5' 6 (1.676 m), weight 52.2 kg, last menstrual period 04/22/2024,  SpO2 100%.  Physical Exam Vitals and nursing note reviewed.  Constitutional:      Appearance: Normal appearance. She is normal weight.  Cardiovascular:     Rate and Rhythm: Normal rate.  Pulmonary:     Effort: Pulmonary effort is normal.  Genitourinary:    Comments: deferred Musculoskeletal:        General: Normal range of motion.  Neurological:     Mental Status: She is alert and oriented to person, place, and time.  Psychiatric:        Mood and Affect: Mood normal.        Behavior: Behavior normal.        Thought Content: Thought content normal.        Judgment: Judgment normal.    MAU Course  Procedures  MDM CCUA UPT NEGATIVE UCx -- Results pending CBC: NM CMP: NM Pelvis Complete TVUS and Torsion R/O ( Ultrasound report reviewed with Dr Jayne)   *Consult with Dr. Erik @ 0720 - notified of patient's complaints, assessments, recommended tx plan get pelvic U/S to r/o ov torsion, CBC, CMP, OTC med for pain.   Consulted with Dr Jayne [facility] MAU Attending) at 1024 when imaging resulted regarding management plan. No interventions needed at this time. Will contact the [facility] to see if we can get an earlier appointment for GYN consult.  Myself and Dr. Jenny OB attending reviewed the images and ultrasound report there is no evidence of torsion at this time.  Will proceed with expectant management and GYN consult to follow    Results for orders placed or performed during the hospital encounter of 05/11/24 (from the past 24 hours)  Pregnancy, urine POC     Status: None   Collection Time: 05/11/24  6:47 AM  Result Value Ref Range   Preg Test, Ur NEGATIVE NEGATIVE    Report given to and care assumed by Olam Dalton, [facility] @ 0747  Ala Cart, CNM 05/11/2024, 7:25 AM     Consulted with Dr Jayne [facility] MAU Attending) at 1024 when imaging resulted regarding management plan. No interventions needed at this time. Will contact the [facility] to see if we can  get an earlier appointment for GYN consult.  Myself and Dr. Jayne  [facility] attending) reviewed the images and ultrasound report there is no evidence of torsion at this time.  Will  proceed with expectant management and GYN consult to follow  I resumed care of this patient and discussed the results and findings with Dr Jayne. Patient is hemodynamically stable for discharge at this time with no acute process.  Plan for discharge and will send message to Office to see if earlier appointment is available for f/u. Motrin  RX sent with S/R/B discussed for pain management Olam Dalton, MSN, [facility], Center for [facility]     Assessment and Plan    Dermoid cyst  Pelvic pain  LLQ pain  Not currently pregnant    PLAN  Discharge from MAU in stable condition  See AVS for full description of educational information and instructions provided to the patient at time of discharge   Warning signs for worsening condition that would warrant emergency follow-up discussed   Patient may return to MAU as needed     Allergies as of 05/11/2024       Reactions   Penicillins    REACTION: itching and ?rash        Medication List     STOP taking these medications    naproxen  500 MG tablet Commonly known as: Naprosyn        TAKE these medications    albuterol  108 (90 Base) MCG/ACT inhaler Commonly known as: Ventolin  HFA Inhale 2 puffs into the lungs every 6 (six) hours as needed for wheezing or shortness of breath.   cyclobenzaprine  10 MG tablet Commonly known as: FLEXERIL  Take 1 tablet (10 mg total) by mouth 3 (three) times daily as needed for muscle spasms.   ibuprofen  600 MG tablet Commonly known as: ADVIL  Take 1 tablet (600 mg total) by mouth every 6 (six) hours as needed for mild pain (pain score 1-3) or moderate pain (pain score 4-6).   metroNIDAZOLE  500 MG tablet Commonly known as: FLAGYL  Take 1 tablet (500 mg total) by mouth 2 (two) times  daily.   olopatadine  0.1 % ophthalmic solution Commonly known as: PATANOL Place 1 drop into both eyes 2 (two) times daily.   oxyCODONE  5 MG immediate release tablet Commonly known as: Roxicodone  Take 1 tablet (5 mg total) by mouth every 4 (four) hours as needed for up to 5 doses.   tobramycin  0.3 % ophthalmic solution Commonly known as: Tobrex  Place 1 drop into both eyes every 4 (four) hours.       Olam Dalton, MSN, [facility], Center for [employer]

## 2024-05-11 NOTE — MAU Note (Signed)
 Tonya Harding is a 23 y.o. at Unknown here in MAU reporting: was seen on 11/9 for ovary pain - has dermoid cyst. Reports pain has worsened and unrelieved with medications - aching pain on left lower abdomen that is constant. Also has intermittent sharp pains in her vagina and lower left abdomen. Denies VB. Currently taking treatment for BV. States she feels like her bladder is always full and only voiding a little bit. Had surgical AB on 10/11 - denies having intercourse since the AB. Pt states she does not believe she is pregnant.   LMP: 04/03/2024 Onset of complaint: on going Pain score: 9 - aching; 7 - sharp Vitals:   05/11/24 0636  BP: 111/83  Pulse: 95  Resp: 19  Temp: 98.5 F (36.9 C)  SpO2: 100%     FHT: NA  Lab orders placed from triage: urine pregnancy

## 2024-05-11 NOTE — ED Triage Notes (Signed)
 Pt POV reporting intense bilateral pelvic pain, hx ovarian cyst, concerned for rupture. Pt seen in ED today, told to follow up with gynecology for surgery, vaginal bleeding and increased pain once she got home.

## 2024-05-12 ENCOUNTER — Emergency Department (HOSPITAL_BASED_OUTPATIENT_CLINIC_OR_DEPARTMENT_OTHER)
Admission: EM | Admit: 2024-05-12 | Discharge: 2024-05-12 | Disposition: A | Discharge status: Home / Self Care | Attending: Emergency Medicine | Admitting: Emergency Medicine

## 2024-05-12 ENCOUNTER — Other Ambulatory Visit: Payer: Self-pay

## 2024-05-12 DIAGNOSIS — N921 Excessive and frequent menstruation with irregular cycle: Secondary | ICD-10-CM

## 2024-05-12 LAB — URINE CULTURE

## 2024-05-12 LAB — HEMOGLOBIN AND HEMATOCRIT, BLOOD
HCT: 34.2 % — ABNORMAL LOW (ref 36.0–46.0)
Hemoglobin: 11.3 g/dL — ABNORMAL LOW (ref 12.0–15.0)

## 2024-05-12 MED ORDER — SODIUM CHLORIDE 0.9 % IV BOLUS
1000.0000 mL | Freq: Once | INTRAVENOUS | Status: AC
  Administered 2024-05-12: 1000 mL via INTRAVENOUS

## 2024-05-12 MED ORDER — KETOROLAC TROMETHAMINE 30 MG/ML IJ SOLN: 30.0000 mg | Freq: Once | INTRAMUSCULAR | Status: DC

## 2024-05-12 MED ORDER — KETOROLAC TROMETHAMINE 30 MG/ML IJ SOLN
30.0000 mg | Freq: Once | INTRAMUSCULAR | Status: AC
  Administered 2024-05-12: 30 mg via INTRAVENOUS
  Filled 2024-05-12: qty 1

## 2024-05-12 MED ORDER — NAPROXEN 500 MG PO TABS: 500.0000 mg | ORAL_TABLET | Freq: Two times a day (BID) | ORAL | 0 refills | Status: DC

## 2024-05-12 NOTE — Discharge Instructions (Signed)
 You were seen today for vaginal bleeding and abdominal pain.  This may be related to your first menstrual period after abortion.  Your ultrasound did show a thickened endometrium and you need repeat ultrasound imaging as previously discussed.  If you develop pain that lateralizes to the left is sharp or any new or worsening symptoms, you need to be reevaluated.  Take naproxen  as needed for pain.  If you bleed more than a tampon or pad per hour for 3 more hours you should also be reevaluated.

## 2024-05-12 NOTE — ED Provider Notes (Signed)
 [place] [facility]ERGENC[facility] DEPARTMENT AT [facility] Provider Note   CSN: [id number] Arrival date & time: 05/11/24  2354     Patient presents with: Pelvic Pain   Tonya Harding is a 24 y.o. female.   HPI     This is a 23 year old female who presents with vaginal bleeding and pelvic pain.  Patient had a surgical abortion on 10/11.  Since that time she has been seen twice for pelvic discomfort and pain.  She was last seen and evaluated yesterday morning at the [facility].  She was found to have a large dermoid cyst without torsion.  She has been referred for outpatient follow-up.  She states that she left that evaluation and develop vaginal bleeding and crampy abdominal pain.  She has not yet had a menstrual period since having the abortion.  She states that this feels worse than her normal..  She denies lateralizing pain and states that the pain from her cyst has generally been dull.  Prior to Admission medications   Medication Sig Start Date End Date Taking? Authorizing Provider  naproxen  (NAPROSYN ) 500 MG tablet Take 1 tablet (500 mg total) by mouth 2 (two) times daily. 05/12/24  Yes Abrahim Sargent, Charmaine FALCON, MD  albuterol  (VENTOLIN  HFA) 108 (90 Base) MCG/ACT inhaler Inhale 2 puffs into the lungs every 6 (six) hours as needed for wheezing or shortness of breath. 08/25/20   Moore, Janece, FNP  cyclobenzaprine  (FLEXERIL ) 10 MG tablet Take 1 tablet (10 mg total) by mouth 3 (three) times daily as needed for muscle spasms. 02/03/22   Gladis, Mary-Margaret, FNP  ibuprofen  (ADVIL ) 600 MG tablet Take 1 tablet (600 mg total) by mouth every 6 (six) hours as needed for mild pain (pain score 1-3) or moderate pain (pain score 4-6). 05/11/24   Cooleen, Olam LABOR, NP  metroNIDAZOLE  (FLAGYL ) 500 MG tablet Take 1 tablet (500 mg total) by mouth 2 (two) times daily. 04/29/24   Hoy Nidia FALCON, PA-C  olopatadine  (PATANOL) 0.1 % ophthalmic solution Place 1 drop into both eyes 2 (two) times daily. 06/20/23   Christopher Savannah,  PA-C  oxyCODONE  (ROXICODONE ) 5 MG immediate release tablet Take 1 tablet (5 mg total) by mouth every 4 (four) hours as needed for up to 5 doses. 04/29/24   Hoy Nidia FALCON, PA-C  tobramycin  (TOBREX ) 0.3 % ophthalmic solution Place 1 drop into both eyes every 4 (four) hours. 06/20/23   Christopher Savannah, PA-C    Allergies: Penicillins    Review of Systems  Constitutional:  Negative for fever.  Respiratory:  Negative for shortness of breath.   Cardiovascular:  Negative for chest pain.  Genitourinary:  Positive for pelvic pain and vaginal bleeding. Negative for dysuria.  All other systems reviewed and are negative.   Updated Vital Signs BP 116/76 (BP Location: Left Arm)   Pulse 95   Temp 98.1 F (36.7 C) (Oral)   Resp 17   Ht 1.676 m (5' 6)   Wt 52.2 kg   LMP 04/22/2024   SpO2 99%   BMI 18.56 kg/m   Physical Exam Vitals and nursing note reviewed.  Constitutional:      Appearance: She is well-developed. She is not ill-appearing.  HENT:     Head: Normocephalic and atraumatic.  Eyes:     Pupils: Pupils are equal, round, and reactive to light.  Cardiovascular:     Rate and Rhythm: Normal rate and regular rhythm.     Heart sounds: Normal heart sounds.  Pulmonary:  Effort: Pulmonary effort is normal. No respiratory distress.     Breath sounds: No wheezing.  Abdominal:     General: Bowel sounds are normal.     Palpations: Abdomen is soft.     Tenderness: There is abdominal tenderness. There is no guarding or rebound.     Comments: Generalized lower abdominal tenderness to palpation, no rebound or guarding  Musculoskeletal:     Cervical back: Neck supple.  Skin:    General: Skin is warm and dry.  Neurological:     Mental Status: She is alert and oriented to person, place, and time.     (all labs ordered are listed, but only abnormal results are displayed) Labs Reviewed  HEMOGLOBIN AND HEMATOCRIT, BLOOD - Abnormal; Notable for the following components:      Result  Value   Hemoglobin 11.3 (*)    HCT 34.2 (*)    All other components within normal limits    EKG: None  Radiology: US  PELVIC COMPLETE W TRANSVAGINAL AND TORSION R/O Result Date: 05/11/2024 EXAM: US  Pelvis, Complete Transvaginal and Transabdominal with Doppler 05/11/2024 09:00:33 AM TECHNIQUE: Transabdominal and transvaginal pelvic duplex ultrasound using B-mode/gray scaled imaging with Doppler spectral analysis and color flow was obtained. COMPARISON: 04/29/2024 CLINICAL HISTORY: [id number] Dermoid cyst of left ovary FINDINGS: UTERUS: Anteverted uterus measures 7.3 x 3.9 x 4.9 cm with a volume of 66.9 cc. Uterus demonstrates normal myometrial echotexture. ENDOMETRIAL STRIPE: Endometrium appears heterogeneous measuring 12 mm. Similar to the previous exam, there are scattered hypoechoic foci within the endometrium, the largest of which measures 0.7 x 0.5 cm. Areas of internal blood flow within the endometrium identified. RIGHT OVARY: The right ovary appears normal, measuring 3.27 x 1.5 x 1.7 cm with a volume of 4.4 cc. There is normal arterial and venous Doppler flow. LEFT OVARY: The left ovary measures 2.9 x 1.5 x 1.9 cm with a volume of 4.3 cc. Within the left adnexa, there is a complex mass with internal blood flow and mixed echogenicity measuring 6.6 x 4.0 x 6.3 cm. This contains complex cystic areas with low-level internal echoes as well as large areas of hyperechogenicity. FREE FLUID: A small volume of free fluid is noted within the pelvis. IMPRESSION: 1. Complex left adnexal mass with internal blood flow and mixed echogenicity measuring 6.6 x 4.0 x 6.3 cm, is favored to represent a dermoid cyst.; recommend gynecology consult and pelvic MRI with IV contrast for further characterization. 2. No signs of ovarian torsion. 3. Heterogeneous endometrium with internal vascularity measuring 12 mm. There are scattered hypoechoic or cystic areas as noted on the previous exam. Findings are nonspecific. In the  setting of recent pregnancy, this could potentially represent retained products if there has been known failed pregnancy. Alternatively, findings may reflect endometrial hyperplasia. Clinical correlation and follow-up pelvic sonogram are advised. Electronically signed by: Waddell Calk MD 05/11/2024 09:17 AM EST RP Workstation: [id number]     Procedures   Medications Ordered in the ED  ketorolac  (TORADOL ) 30 MG/ML injection 30 mg (30 mg Intravenous Given 05/12/24 0216)  sodium chloride  0.9 % bolus 1,000 mL (1,000 mLs Intravenous New Bag/Given 05/12/24 0215)    Clinical Course as of 05/12/24 0302  Sat May 12, 2024  0300 Patient improved upon recheck.  No complaints of abdominal pain.  Hemoglobin stable.  Discussed with her that I have reviewed her ultrasound images.  I have low suspicion for torsion at this time.  She could be experiencing her first menstrual cycle after her abortion.  Her ultrasound did show a thickened endometrium that was slightly irregular.  She will need repeat ultrasound regardless.  Will treat with NSAIDs. [CH]    Clinical Course User Index [CH] Ravin Bendall, Charmaine FALCON, MD                                 Medical Decision Making Amount and/or Complexity of Data Reviewed Labs: ordered.  Risk Prescription drug management.   This patient presents to the ED for concern of vaginal bleeding abdominal pain, this involves an extensive number of treatment options, and is a complaint that carries with it a high risk of complications and morbidity.  I considered the following differential and admission for this acute, potentially life threatening condition.  The differential diagnosis includes menorrhagia, first menstrual cycle following abortion, dysfunctional uterine bleeding pain related to cyst or torsion  MDM:    This is a 24 year old female who presents with vaginal bleeding and abdominal pain.  Nontoxic.  Vital signs initially notable for tachycardia but this had improved  upon my evaluation.  She has some nonlocalizing tenderness to palpation of the lower exam.  I reviewed her chart.  She was seen in the ED on 11/9.  At that time had an ultrasound and a pelvic exam.  Pelvic exam was reassuring.  Ultrasound indicated for dermoid cyst.  She was subsequently seen yesterday morning with large left dermoid cyst with no signs of torsion.  She did have a hyperechoic thickened endometrium with recommended repeat ultrasound.  Given that she has not yet had a menstrual period after her surgical abortion, this could just be a period.  I have reviewed her prior testing and urinalysis.  Urine pregnancy was negative less than 24 hours ago.  Repeat hemoglobin here is stable.  Patient was given a dose of Toradol .  Symptoms completely resolved.  No evidence of significant hemorrhage and improved pain.  Doubt torsion.  High suspicion that this may be initial menstrual cycle after abortion and given thickened endometrium and ultrasound findings yesterday, not unusual that this may be more painful with more bleeding.  Patient was given precautions.  Will discharge with naproxen .  OB follow-up has previously been arranged.  (Labs, imaging, consults)  Labs: I Ordered, and personally interpreted labs.  The pertinent results include: Hemoglobin  Imaging Studies ordered: I ordered imaging studies including ultrasound reviewed I independently visualized and interpreted imaging. I agree with the radiologist interpretation  Additional history obtained from chart review.  External records from outside source obtained and reviewed including prior evaluations  Cardiac Monitoring: The patient was maintained on a cardiac monitor.  If on the cardiac monitor, I personally viewed and interpreted the cardiac monitored which showed an underlying rhythm of: Sinus  Reevaluation: After the interventions noted above, I reevaluated the patient and found that they have :resolved  Social Determinants of  Health:  lives independently  Disposition: Discharge  Co morbidities that complicate the patient evaluation  Past Medical History:  Diagnosis Date   Asthma    Eczema      Medicines Meds ordered this encounter  Medications   DISCONTD: ketorolac  (TORADOL ) 30 MG/ML injection 30 mg   ketorolac  (TORADOL ) 30 MG/ML injection 30 mg   sodium chloride  0.9 % bolus 1,000 mL   naproxen  (NAPROSYN ) 500 MG tablet    Sig: Take 1 tablet (500 mg total) by mouth 2 (two) times daily.    Dispense:  30 tablet  Refill:  0    I have reviewed the patients home medicines and have made adjustments as needed  Problem List / ED Course: Problem List Items Addressed This Visit   None Visit Diagnoses       Menorrhagia with irregular cycle    -  Primary                Final diagnoses:  Menorrhagia with irregular cycle    ED Discharge Orders          Ordered    naproxen  (NAPROSYN ) 500 MG tablet  2 times daily        05/12/24 0301               Marvena Tally, Charmaine FALCON, MD 05/12/24 0330

## 2024-07-05 ENCOUNTER — Other Ambulatory Visit: Payer: Self-pay

## 2024-07-05 ENCOUNTER — Ambulatory Visit (INDEPENDENT_AMBULATORY_CARE_PROVIDER_SITE_OTHER): Admitting: Obstetrics and Gynecology

## 2024-07-05 VITALS — BP 120/80 | HR 105 | Wt 114.7 lb

## 2024-07-05 DIAGNOSIS — R102 Pelvic and perineal pain unspecified side: Secondary | ICD-10-CM | POA: Diagnosis not present

## 2024-07-05 DIAGNOSIS — N719 Inflammatory disease of uterus, unspecified: Secondary | ICD-10-CM | POA: Diagnosis not present

## 2024-07-05 DIAGNOSIS — D369 Benign neoplasm, unspecified site: Secondary | ICD-10-CM

## 2024-07-05 MED ORDER — BACLOFEN 10 MG PO TABS: 10.0000 mg | ORAL_TABLET | Freq: Three times a day (TID) | ORAL | 0 refills | Status: DC

## 2024-07-05 MED ORDER — ONDANSETRON HCL 4 MG PO TABS: 4.0000 mg | ORAL_TABLET | Freq: Three times a day (TID) | ORAL | 0 refills | Status: DC | PRN

## 2024-07-05 MED ORDER — METRONIDAZOLE 500 MG PO TABS: 500.0000 mg | ORAL_TABLET | Freq: Two times a day (BID) | ORAL | 0 refills | Status: AC

## 2024-07-05 MED ORDER — CLONIDINE HCL 0.1 MG PO TABS: 0.1000 mg | ORAL_TABLET | Freq: Two times a day (BID) | ORAL | 0 refills | Status: AC | PRN

## 2024-07-05 MED ORDER — DOXYCYCLINE HYCLATE 100 MG PO CAPS: 100.0000 mg | ORAL_CAPSULE | Freq: Two times a day (BID) | ORAL | 0 refills | Status: AC

## 2024-07-05 NOTE — H&P (View-Only) (Signed)
 "   GYNECOLOGY VISIT  Patient name: Tonya Harding MRN [id number]  Date of birth: 23-Aug-2000 Chief Complaint:   Gynecologic Exam  History:  Tonya Harding here for surgical consult regarding ovarian cyst. Surgical TAB 10/11 and having constant pain since then and informed of cyst on ovary on subsequent ED visit. No pains help the pain. Naproxen  doesn't help. When driving, was bleeding very heavy and then bled black fo r 2 weeks following 11/23. Pain typicallin LLQ and down her leg and now across the [facility] front of her abdomen and left side of back.  Similar to the pain during the procedure and has been going on since the procedure. Given oxycodone  and they didn't help with the pain and told to return to get a stronger prescription if needed. Given ibuprofen  which was declined sice she knew. During the procedure ws given ABX unsure. Abnormal vaginal discharge present - black discharge passed a black, ~4cm blood clot. Was in significant pai nand told it was just period pain and then passed the blood clot. No fever or chills. Has had some nausea and vomiting in response to some of the medication prescribed to her. Had been unable to keep anything down. Note there was pain when completing the vaginal ultrasound there was pain when they were moving the probe, particularly prior to the ultrasound. Had an US  completed at 6 weeks and then US  done at the time of procedure without mention of a cyst. Occasionally she has sharp pain into the back  Identifies as a lesbian and intends to date femmes moving forward and has no intention of ever getting pregnant again     The following portions of the patient's history were reviewed and updated as appropriate: allergies, current medications, past family history, past medical history, past social history, past surgical history and problem list.   Health Maintenance:   Last pap No results found for: EDMON [facility], ADEQPAP  Health Maintenance  Topic Date Due    DTaP/Tdap/Td vaccine (6 - Td or Tdap) 01/30/2020   Flu Shot  01/20/2024   COVID-19 Vaccine (4 - 2025-26 season) 02/20/2024   Pap Smear  07/20/2026   Pneumococcal Vaccine  Completed   Hepatitis B Vaccine  Completed   HPV Vaccine  Completed   Hepatitis C Screening  Completed   HIV Screening  Completed   Meningitis B Vaccine  Aged Out      Review of Systems:  Pertinent items are noted in HPI. Comprehensive review of systems was otherwise negative.   Objective:  Physical Exam BP 120/80   Pulse (!) 105   Wt 114 lb 11.2 oz (52 kg)   Breastfeeding No   BMI 18.51 kg/m    Physical Exam Vitals and nursing note reviewed.  Constitutional:      Appearance: Normal appearance.  HENT:     Head: Normocephalic and atraumatic.  Pulmonary:     Effort: Pulmonary effort is normal.  Abdominal:     Tenderness: There is abdominal tenderness in the suprapubic area and left lower quadrant.     Comments: Mild lower abodminal tenderness   Skin:    General: Skin is warm and dry.  Neurological:     General: No focal deficit present.     Mental Status: She is alert.  Psychiatric:        Mood and Affect: Mood normal.        Behavior: Behavior normal.        Thought Content: Thought content  normal.        Judgment: Judgment normal.      Labs and Imaging FINDINGS:   UTERUS: Anteverted uterus measures 7.3 x 3.9 x 4.9 cm with a volume of 66.9 cc. Uterus demonstrates normal myometrial echotexture.   ENDOMETRIAL STRIPE: Endometrium appears heterogeneous measuring 12 mm. Similar to the previous exam, there are scattered hypoechoic foci within the endometrium, the largest of which measures 0.7 x 0.5 cm. Areas of internal blood flow within the endometrium identified.   RIGHT OVARY: The right ovary appears normal, measuring 3.27 x 1.5 x 1.7 cm with a volume of 4.4 cc. There is normal arterial and venous Doppler flow.   LEFT OVARY: The left ovary measures 2.9 x 1.5 x 1.9 cm with a volume  of 4.3 cc. Within the left adnexa, there is a complex mass with internal blood flow and mixed echogenicity measuring 6.6 x 4.0 x 6.3 cm. This contains complex cystic areas with low-level internal echoes as well as large areas of hyperechogenicity.   FREE FLUID: A small volume of free fluid is noted within the pelvis.   IMPRESSION: 1. Complex left adnexal mass with internal blood flow and mixed echogenicity measuring 6.6 x 4.0 x 6.3 cm, is favored to represent a dermoid cyst.; recommend gynecology consult and pelvic MRI with IV contrast for further characterization. 2. No signs of ovarian torsion. 3. Heterogeneous endometrium with internal vascularity measuring 12 mm. There are scattered hypoechoic or cystic areas as noted on the previous exam. Findings are nonspecific. In the setting of recent pregnancy, this could potentially represent retained products if there has been known failed pregnancy. Alternatively, findings may reflect endometrial hyperplasia. Clinical correlation and follow-up pelvic sonogram are advised.   Electronically signed by: Waddell Calk MD 05/11/2024 09:17 AM EST RP Workstation: [id number]     Assessment & Plan:  1. Dermoid cyst (Primary) 2. Pelvic pain 3. Endometritis Reviewed imaging and concern for continuously thickened endometrial stripe since procedure in October in addition to passing dark/black clots with bleeding and pain. Hcg negative on last check. Will presume endometritis vs PID possible, will treat now with 2 weeks of antibiotics. Discussed ovarian cystectomy with possible oophorectomy, pending surgical findings. Will additionally plan for diagnostic hysteroscopy with possible IUD insertion for management of bleeding going forward. Baclofen  and clonidine  for pain as needed in addition to OTC analgesia and referral placed for surgical scheduling.   Patient desires surgical management with ovarian cystectomy and recommend hysteroscopy with D&C at that  time.  The risks of surgery were discussed in detail with the patient including but not limited to: bleeding which may require transfusion or reoperation; infection which may require prolonged hospitalization or re-hospitalization and antibiotic therapy; injury to bowel, bladder, ureters and major vessels or other surrounding organs which may lead to other procedures; formation of adhesions; need for additional procedures including laparotomy or subsequent procedures secondary to intraoperative injury or abnormal pathology; thromboembolic phenomenon; incisional problems and other postoperative or anesthesia complications.  Patient was told that the likelihood that her condition and symptoms will be treated effectively with this surgical management was high; the postoperative expectations were also discussed in detail. The patient also understands the alternative treatment options which were discussed in full. All questions were answered.   Printed patient education handouts about the procedure were given to the patient to review at home.  - Ambulatory Referral For Surgery Scheduling - doxycycline  (VIBRAMYCIN ) 100 MG capsule; Take 1 capsule (100 mg total) by mouth 2 (two) times  daily for 14 days.  Dispense: 28 capsule; Refill: 0 - cloNIDine  (CATAPRES ) 0.1 MG tablet; Take 1 tablet (0.1 mg total) by mouth 2 (two) times daily as needed.  Dispense: 30 tablet; Refill: 0 - baclofen  (LIORESAL ) 10 MG tablet; Take 1 tablet (10 mg total) by mouth 3 (three) times daily.  Dispense: 40 each; Refill: 0 - ondansetron  (ZOFRAN ) 4 MG tablet; Take 1 tablet (4 mg total) by mouth every 8 (eight) hours as needed for nausea or vomiting.  Dispense: 20 tablet; Refill: 0 - metroNIDAZOLE  (FLAGYL ) 500 MG tablet; Take 1 tablet (500 mg total) by mouth 2 (two) times daily.  Dispense: 28 tablet; Refill: 0       Carter Quarry, MD Minimally Invasive Gynecologic Surgery [facility], [facility] "

## 2024-07-05 NOTE — Progress Notes (Unsigned)
 "   GYNECOLOGY VISIT  Patient name: Tonya Harding MRN [id number]  Date of birth: 23-Aug-2000 Chief Complaint:   Gynecologic Exam  History:  Nedra L Mcneel here for surgical consult regarding ovarian cyst. Surgical TAB 10/11 and having constant pain since then and informed of cyst on ovary on subsequent ED visit. No pains help the pain. Naproxen  doesn't help. When driving, was bleeding very heavy and then bled black fo r 2 weeks following 11/23. Pain typicallin LLQ and down her leg and now across the [facility] front of her abdomen and left side of back.  Similar to the pain during the procedure and has been going on since the procedure. Given oxycodone  and they didn't help with the pain and told to return to get a stronger prescription if needed. Given ibuprofen  which was declined sice she knew. During the procedure ws given ABX unsure. Abnormal vaginal discharge present - black discharge passed a black, ~4cm blood clot. Was in significant pai nand told it was just period pain and then passed the blood clot. No fever or chills. Has had some nausea and vomiting in response to some of the medication prescribed to her. Had been unable to keep anything down. Note there was pain when completing the vaginal ultrasound there was pain when they were moving the probe, particularly prior to the ultrasound. Had an US  completed at 6 weeks and then US  done at the time of procedure without mention of a cyst. Occasionally she has sharp pain into the back  Identifies as a lesbian and intends to date femmes moving forward and has no intention of ever getting pregnant again     The following portions of the patient's history were reviewed and updated as appropriate: allergies, current medications, past family history, past medical history, past social history, past surgical history and problem list.   Health Maintenance:   Last pap No results found for: EDMON [facility], ADEQPAP  Health Maintenance  Topic Date Due    DTaP/Tdap/Td vaccine (6 - Td or Tdap) 01/30/2020   Flu Shot  01/20/2024   COVID-19 Vaccine (4 - 2025-26 season) 02/20/2024   Pap Smear  07/20/2026   Pneumococcal Vaccine  Completed   Hepatitis B Vaccine  Completed   HPV Vaccine  Completed   Hepatitis C Screening  Completed   HIV Screening  Completed   Meningitis B Vaccine  Aged Out      Review of Systems:  Pertinent items are noted in HPI. Comprehensive review of systems was otherwise negative.   Objective:  Physical Exam BP 120/80   Pulse (!) 105   Wt 114 lb 11.2 oz (52 kg)   Breastfeeding No   BMI 18.51 kg/m    Physical Exam Vitals and nursing note reviewed.  Constitutional:      Appearance: Normal appearance.  HENT:     Head: Normocephalic and atraumatic.  Pulmonary:     Effort: Pulmonary effort is normal.  Abdominal:     Tenderness: There is abdominal tenderness in the suprapubic area and left lower quadrant.     Comments: Mild lower abodminal tenderness   Skin:    General: Skin is warm and dry.  Neurological:     General: No focal deficit present.     Mental Status: She is alert.  Psychiatric:        Mood and Affect: Mood normal.        Behavior: Behavior normal.        Thought Content: Thought content  normal.        Judgment: Judgment normal.      Labs and Imaging FINDINGS:   UTERUS: Anteverted uterus measures 7.3 x 3.9 x 4.9 cm with a volume of 66.9 cc. Uterus demonstrates normal myometrial echotexture.   ENDOMETRIAL STRIPE: Endometrium appears heterogeneous measuring 12 mm. Similar to the previous exam, there are scattered hypoechoic foci within the endometrium, the largest of which measures 0.7 x 0.5 cm. Areas of internal blood flow within the endometrium identified.   RIGHT OVARY: The right ovary appears normal, measuring 3.27 x 1.5 x 1.7 cm with a volume of 4.4 cc. There is normal arterial and venous Doppler flow.   LEFT OVARY: The left ovary measures 2.9 x 1.5 x 1.9 cm with a volume  of 4.3 cc. Within the left adnexa, there is a complex mass with internal blood flow and mixed echogenicity measuring 6.6 x 4.0 x 6.3 cm. This contains complex cystic areas with low-level internal echoes as well as large areas of hyperechogenicity.   FREE FLUID: A small volume of free fluid is noted within the pelvis.   IMPRESSION: 1. Complex left adnexal mass with internal blood flow and mixed echogenicity measuring 6.6 x 4.0 x 6.3 cm, is favored to represent a dermoid cyst.; recommend gynecology consult and pelvic MRI with IV contrast for further characterization. 2. No signs of ovarian torsion. 3. Heterogeneous endometrium with internal vascularity measuring 12 mm. There are scattered hypoechoic or cystic areas as noted on the previous exam. Findings are nonspecific. In the setting of recent pregnancy, this could potentially represent retained products if there has been known failed pregnancy. Alternatively, findings may reflect endometrial hyperplasia. Clinical correlation and follow-up pelvic sonogram are advised.   Electronically signed by: Waddell Calk MD 05/11/2024 09:17 AM EST RP Workstation: [id number]     Assessment & Plan:  1. Dermoid cyst (Primary) 2. Pelvic pain 3. Endometritis Reviewed imaging and concern for continuously thickened endometrial stripe since procedure in October in addition to passing dark/black clots with bleeding and pain. Hcg negative on last check. Will presume endometritis vs PID possible, will treat now with 2 weeks of antibiotics. Discussed ovarian cystectomy with possible oophorectomy, pending surgical findings. Will additionally plan for diagnostic hysteroscopy with possible IUD insertion for management of bleeding going forward. Baclofen  and clonidine  for pain as needed in addition to OTC analgesia and referral placed for surgical scheduling.   Patient desires surgical management with ovarian cystectomy and recommend hysteroscopy with D&C at that  time.  The risks of surgery were discussed in detail with the patient including but not limited to: bleeding which may require transfusion or reoperation; infection which may require prolonged hospitalization or re-hospitalization and antibiotic therapy; injury to bowel, bladder, ureters and major vessels or other surrounding organs which may lead to other procedures; formation of adhesions; need for additional procedures including laparotomy or subsequent procedures secondary to intraoperative injury or abnormal pathology; thromboembolic phenomenon; incisional problems and other postoperative or anesthesia complications.  Patient was told that the likelihood that her condition and symptoms will be treated effectively with this surgical management was high; the postoperative expectations were also discussed in detail. The patient also understands the alternative treatment options which were discussed in full. All questions were answered.   Printed patient education handouts about the procedure were given to the patient to review at home.  - Ambulatory Referral For Surgery Scheduling - doxycycline  (VIBRAMYCIN ) 100 MG capsule; Take 1 capsule (100 mg total) by mouth 2 (two) times  daily for 14 days.  Dispense: 28 capsule; Refill: 0 - cloNIDine  (CATAPRES ) 0.1 MG tablet; Take 1 tablet (0.1 mg total) by mouth 2 (two) times daily as needed.  Dispense: 30 tablet; Refill: 0 - baclofen  (LIORESAL ) 10 MG tablet; Take 1 tablet (10 mg total) by mouth 3 (three) times daily.  Dispense: 40 each; Refill: 0 - ondansetron  (ZOFRAN ) 4 MG tablet; Take 1 tablet (4 mg total) by mouth every 8 (eight) hours as needed for nausea or vomiting.  Dispense: 20 tablet; Refill: 0 - metroNIDAZOLE  (FLAGYL ) 500 MG tablet; Take 1 tablet (500 mg total) by mouth 2 (two) times daily.  Dispense: 28 tablet; Refill: 0       Carter Quarry, MD Minimally Invasive Gynecologic Surgery Center for [facility], [facility] "

## 2024-07-10 NOTE — Progress Notes (Signed)
 Pt does not have a voicemail active. Unable to lease message

## 2024-07-11 ENCOUNTER — Encounter (HOSPITAL_COMMUNITY): Payer: Self-pay | Admitting: Obstetrics and Gynecology

## 2024-07-11 NOTE — Progress Notes (Addendum)
 Spoke w/ via phone for pre-op interview---[name] needs dos----  UPT       Lab results------ COVID test -----patient states asymptomatic no test needed Arrive at -------0930 NPO after MN NO Solid Food.  Clear liquids from MN until---0830 Pre-Surgery Ensure or G2:  Med rec completed Medications to take morning of surgery -----N/A Diabetic medication -----  GLP1 agonist last dose: GLP1 instructions:  Patient instructed no nail polish to be worn day of surgery Patient instructed to bring photo id and insurance card day of surgery Patient aware to have Driver (ride ) / caregiver    for 24 hours after surgery - Mom) Tonya Harding Patient Special Instructions ----- Pre-Op special Instructions -----[name] PENDING  Patient verbalized understanding of instructions that were given at this phone interview. Patient denies chest pain, sob, fever, cough at the interview.

## 2024-07-12 ENCOUNTER — Other Ambulatory Visit: Payer: Self-pay | Admitting: Obstetrics and Gynecology

## 2024-07-12 ENCOUNTER — Encounter (HOSPITAL_COMMUNITY): Payer: Self-pay | Admitting: Obstetrics and Gynecology

## 2024-07-12 ENCOUNTER — Ambulatory Visit (HOSPITAL_COMMUNITY)
Admission: RE | Admit: 2024-07-12 | Discharge: 2024-07-12 | Disposition: A | Discharge status: Home / Self Care | Attending: Obstetrics and Gynecology | Admitting: Obstetrics and Gynecology

## 2024-07-12 ENCOUNTER — Ambulatory Visit (HOSPITAL_COMMUNITY): Admitting: Anesthesiology

## 2024-07-12 ENCOUNTER — Other Ambulatory Visit (HOSPITAL_COMMUNITY): Payer: Self-pay

## 2024-07-12 ENCOUNTER — Other Ambulatory Visit: Payer: Self-pay

## 2024-07-12 ENCOUNTER — Encounter (HOSPITAL_COMMUNITY): Admission: RE | Disposition: A | Payer: Self-pay | Source: Home / Self Care | Attending: Obstetrics and Gynecology

## 2024-07-12 ENCOUNTER — Ambulatory Visit (HOSPITAL_BASED_OUTPATIENT_CLINIC_OR_DEPARTMENT_OTHER): Admitting: Anesthesiology

## 2024-07-12 DIAGNOSIS — N946 Dysmenorrhea, unspecified: Secondary | ICD-10-CM | POA: Insufficient documentation

## 2024-07-12 DIAGNOSIS — D279 Benign neoplasm of unspecified ovary: Secondary | ICD-10-CM | POA: Diagnosis present

## 2024-07-12 DIAGNOSIS — N9489 Other specified conditions associated with female genital organs and menstrual cycle: Secondary | ICD-10-CM

## 2024-07-12 DIAGNOSIS — D369 Benign neoplasm, unspecified site: Secondary | ICD-10-CM

## 2024-07-12 DIAGNOSIS — R1032 Left lower quadrant pain: Secondary | ICD-10-CM

## 2024-07-12 DIAGNOSIS — N719 Inflammatory disease of uterus, unspecified: Secondary | ICD-10-CM | POA: Diagnosis not present

## 2024-07-12 DIAGNOSIS — Z01818 Encounter for other preprocedural examination: Secondary | ICD-10-CM

## 2024-07-12 DIAGNOSIS — N939 Abnormal uterine and vaginal bleeding, unspecified: Secondary | ICD-10-CM

## 2024-07-12 DIAGNOSIS — R102 Pelvic and perineal pain unspecified side: Secondary | ICD-10-CM

## 2024-07-12 LAB — ABO/RH: ABO/RH(D): O POS

## 2024-07-12 LAB — CBC
HCT: 34.9 % — ABNORMAL LOW (ref 36.0–46.0)
Hemoglobin: 10.8 g/dL — ABNORMAL LOW (ref 12.0–15.0)
MCH: 24.4 pg — ABNORMAL LOW (ref 26.0–34.0)
MCHC: 30.9 g/dL (ref 30.0–36.0)
MCV: 79 fL — ABNORMAL LOW (ref 80.0–100.0)
Platelets: 308 K/uL (ref 150–400)
RBC: 4.42 MIL/uL (ref 3.87–5.11)
RDW: 15.2 % (ref 11.5–15.5)
WBC: 7 K/uL (ref 4.0–10.5)
nRBC: 0 % (ref 0.0–0.2)

## 2024-07-12 LAB — TYPE AND SCREEN
ABO/RH(D): O POS
Antibody Screen: NEGATIVE

## 2024-07-12 LAB — POCT PREGNANCY, URINE: Preg Test, Ur: NEGATIVE

## 2024-07-12 MED ORDER — PROPOFOL 10 MG/ML IV BOLUS
INTRAVENOUS | Status: AC
  Filled 2024-07-12: qty 20

## 2024-07-12 MED ORDER — ONDANSETRON HCL 4 MG/2ML IJ SOLN
INTRAMUSCULAR | Status: DC | PRN
  Administered 2024-07-12: 4 mg via INTRAVENOUS

## 2024-07-12 MED ORDER — CHLORHEXIDINE GLUCONATE 0.12 % MT SOLN
OROMUCOSAL | Status: AC
  Filled 2024-07-12: qty 15

## 2024-07-12 MED ORDER — ONDANSETRON HCL 4 MG/2ML IJ SOLN
INTRAMUSCULAR | Status: AC
  Filled 2024-07-12: qty 2

## 2024-07-12 MED ORDER — OXYCODONE HCL 5 MG/5ML PO SOLN: 5.0000 mg | Freq: Once | ORAL | Status: AC | PRN

## 2024-07-12 MED ORDER — SUGAMMADEX SODIUM 200 MG/2ML IV SOLN
INTRAVENOUS | Status: DC | PRN
  Administered 2024-07-12: 200 mg via INTRAVENOUS

## 2024-07-12 MED ORDER — OXYCODONE HCL 5 MG PO TABS
ORAL_TABLET | ORAL | Status: AC
  Filled 2024-07-12: qty 1

## 2024-07-12 MED ORDER — LACTATED RINGERS IV SOLN: INTRAVENOUS | Status: DC

## 2024-07-12 MED ORDER — OXYCODONE HCL 5 MG PO TABS
5.0000 mg | ORAL_TABLET | ORAL | 0 refills | Status: AC | PRN
  Filled 2024-07-12: qty 15, 3d supply, fill #0

## 2024-07-12 MED ORDER — SODIUM CHLORIDE 0.9 % IR SOLN
Status: DC | PRN
  Administered 2024-07-12: 1000 mL
  Administered 2024-07-12: 3000 mL

## 2024-07-12 MED ORDER — ORAL CARE MOUTH RINSE: 15.0000 mL | Freq: Once | OROMUCOSAL | Status: AC

## 2024-07-12 MED ORDER — PROPOFOL 10 MG/ML IV BOLUS
INTRAVENOUS | Status: DC | PRN
  Administered 2024-07-12: 130 mg via INTRAVENOUS
  Administered 2024-07-12: 50 mg via INTRAVENOUS

## 2024-07-12 MED ORDER — ONDANSETRON HCL 4 MG PO TABS
4.0000 mg | ORAL_TABLET | Freq: Three times a day (TID) | ORAL | 0 refills | Status: AC | PRN
  Filled 2024-07-12: qty 20, 7d supply, fill #0

## 2024-07-12 MED ORDER — BACLOFEN 10 MG PO TABS
10.0000 mg | ORAL_TABLET | Freq: Three times a day (TID) | ORAL | 1 refills | Status: AC
  Filled 2024-07-12: qty 60, 20d supply, fill #0

## 2024-07-12 MED ORDER — CHLORHEXIDINE GLUCONATE 0.12 % MT SOLN
15.0000 mL | Freq: Once | OROMUCOSAL | Status: AC
  Administered 2024-07-12: 15 mL via OROMUCOSAL

## 2024-07-12 MED ORDER — MIDAZOLAM HCL (PF) 2 MG/2ML IJ SOLN
INTRAMUSCULAR | Status: DC | PRN
  Administered 2024-07-12: 2 mg via INTRAVENOUS

## 2024-07-12 MED ORDER — ACETAMINOPHEN 500 MG PO TABS: 1000.0000 mg | ORAL_TABLET | ORAL | Status: DC

## 2024-07-12 MED ORDER — POLYETHYLENE GLYCOL 3350 17 GM/SCOOP PO POWD
17.0000 g | Freq: Every day | ORAL | 0 refills | Status: AC
  Filled 2024-07-12: qty 476, 28d supply, fill #0

## 2024-07-12 MED ORDER — ROCURONIUM BROMIDE 10 MG/ML (PF) SYRINGE
PREFILLED_SYRINGE | INTRAVENOUS | Status: AC
  Filled 2024-07-12: qty 10

## 2024-07-12 MED ORDER — ROCURONIUM BROMIDE 10 MG/ML (PF) SYRINGE
PREFILLED_SYRINGE | INTRAVENOUS | Status: DC | PRN
  Administered 2024-07-12: 40 mg via INTRAVENOUS

## 2024-07-12 MED ORDER — DEXMEDETOMIDINE HCL IN NACL 80 MCG/20ML IV SOLN
INTRAVENOUS | Status: DC | PRN
  Administered 2024-07-12: 8 ug via INTRAVENOUS

## 2024-07-12 MED ORDER — LIDOCAINE 2% (20 MG/ML) 5 ML SYRINGE
INTRAMUSCULAR | Status: AC
  Filled 2024-07-12: qty 5

## 2024-07-12 MED ORDER — GLYCOPYRROLATE PF 0.2 MG/ML IJ SOSY
PREFILLED_SYRINGE | INTRAMUSCULAR | Status: DC | PRN
  Administered 2024-07-12: .1 mg via INTRAVENOUS

## 2024-07-12 MED ORDER — IBUPROFEN 600 MG PO TABS
600.0000 mg | ORAL_TABLET | Freq: Four times a day (QID) | ORAL | 1 refills | Status: AC | PRN
  Filled 2024-07-12: qty 60, 15d supply, fill #0

## 2024-07-12 MED ORDER — DEXAMETHASONE SOD PHOSPHATE PF 10 MG/ML IJ SOLN
INTRAMUSCULAR | Status: DC | PRN
  Administered 2024-07-12: 5 mg via INTRAVENOUS

## 2024-07-12 MED ORDER — DEXAMETHASONE SOD PHOSPHATE PF 10 MG/ML IJ SOLN
INTRAMUSCULAR | Status: AC
  Filled 2024-07-12: qty 1

## 2024-07-12 MED ORDER — POVIDONE-IODINE 10 % EX SWAB: 2.0000 | Freq: Once | CUTANEOUS | Status: DC

## 2024-07-12 MED ORDER — DROPERIDOL 2.5 MG/ML IJ SOLN: 0.6250 mg | Freq: Once | INTRAMUSCULAR | Status: DC | PRN

## 2024-07-12 MED ORDER — FENTANYL CITRATE (PF) 250 MCG/5ML IJ SOLN
INTRAMUSCULAR | Status: AC
  Filled 2024-07-12: qty 5

## 2024-07-12 MED ORDER — MIDAZOLAM HCL 2 MG/2ML IJ SOLN
INTRAMUSCULAR | Status: AC
  Filled 2024-07-12: qty 2

## 2024-07-12 MED ORDER — FENTANYL CITRATE (PF) 100 MCG/2ML IJ SOLN: 25.0000 ug | INTRAMUSCULAR | Status: DC | PRN

## 2024-07-12 MED ORDER — ACETAMINOPHEN 500 MG PO TABS
500.0000 mg | ORAL_TABLET | Freq: Once | ORAL | Status: AC
  Administered 2024-07-12: 500 mg via ORAL

## 2024-07-12 MED ORDER — BUPIVACAINE LIPOSOME 1.3 % IJ SUSP
INTRAMUSCULAR | Status: DC | PRN
  Administered 2024-07-12: 40 mL

## 2024-07-12 MED ORDER — BUPIVACAINE HCL (PF) 0.5 % IJ SOLN
INTRAMUSCULAR | Status: AC
  Filled 2024-07-12: qty 30

## 2024-07-12 MED ORDER — FENTANYL CITRATE (PF) 250 MCG/5ML IJ SOLN
INTRAMUSCULAR | Status: DC | PRN
  Administered 2024-07-12: 50 ug via INTRAVENOUS
  Administered 2024-07-12: 100 ug via INTRAVENOUS

## 2024-07-12 MED ORDER — LIDOCAINE 2% (20 MG/ML) 5 ML SYRINGE
INTRAMUSCULAR | Status: DC | PRN
  Administered 2024-07-12: 50 mg via INTRAVENOUS

## 2024-07-12 MED ORDER — OXYCODONE HCL 5 MG PO TABS
5.0000 mg | ORAL_TABLET | Freq: Once | ORAL | Status: AC | PRN
  Administered 2024-07-12: 5 mg via ORAL

## 2024-07-12 MED ORDER — ACETAMINOPHEN 500 MG PO TABS
ORAL_TABLET | ORAL | Status: AC
  Filled 2024-07-12: qty 1

## 2024-07-12 NOTE — Anesthesia Preprocedure Evaluation (Deleted)
"                                    Anesthesia Evaluation    Airway        Dental   Pulmonary Patient abstained from smoking.          Cardiovascular      Neuro/Psych    GI/Hepatic   Endo/Other    Renal/GU      Musculoskeletal   Abdominal   Peds  Hematology   Anesthesia Other Findings   Reproductive/Obstetrics                              Anesthesia Physical Anesthesia Plan Anesthesia Quick Evaluation  "

## 2024-07-12 NOTE — Transfer of Care (Signed)
 Immediate Anesthesia Transfer of Care Note  Patient: Tonya Harding  Procedure(s) Performed: DILATATION AND CURETTAGE /HYSTEROSCOPY (Uterus) LAPAROSCOPY, DIAGNOSTIC (Pelvis)  Patient Location: PACU  Anesthesia Type:General  Level of Consciousness: awake, alert , and oriented  Airway & Oxygen Therapy: Patient Spontanous Breathing  Post-op Assessment: Report given to RN and Post -op Vital signs reviewed and stable  Post vital signs: Reviewed and stable  Last Vitals:  Vitals Value Taken Time  BP 123/85 07/12/24 12:50  Temp 36.4 C 07/12/24 12:50  Pulse 78 07/12/24 12:52  Resp 28 07/12/24 12:52  SpO2 100 % 07/12/24 12:52  Vitals shown include unfiled device data.  Last Pain:  Vitals:   07/12/24 0939  TempSrc: Oral  PainSc: 0-No pain      Patients Stated Pain Goal: 6 (07/12/24 0939)  Complications: No notable events documented.

## 2024-07-12 NOTE — Anesthesia Postprocedure Evaluation (Signed)
"   Anesthesia Post Note  Patient: Tonya Harding  Procedure(s) Performed: DILATATION AND CURETTAGE /HYSTEROSCOPY (Uterus) LAPAROSCOPY, DIAGNOSTIC (Pelvis)     Patient location during evaluation: PACU Anesthesia Type: General Level of consciousness: awake and alert Pain management: pain level controlled Vital Signs Assessment: post-procedure vital signs reviewed and stable Respiratory status: spontaneous breathing, nonlabored ventilation, respiratory function stable and patient connected to nasal cannula oxygen Cardiovascular status: blood pressure returned to baseline and stable Postop Assessment: no apparent nausea or vomiting Anesthetic complications: no   No notable events documented.  Last Vitals:  Vitals:   07/12/24 0939 07/12/24 1250  BP: 118/82 123/85  Pulse: 93 94  Resp: 16 17  Temp: 36.8 C (!) 36.4 C  SpO2: 100% 95%    Last Pain:  Vitals:   07/12/24 0939  TempSrc: Oral  PainSc: 0-No pain                 Rome Ade      "

## 2024-07-12 NOTE — Discharge Instructions (Signed)
Post-surgical Instructions, Outpatient Surgery  You may expect to feel dizzy, weak, and drowsy for as long as 24 hours after receiving the medicine that made you sleep (anesthetic). For the first 24 hours after your surgery:   Do not drive a car, ride a bicycle, participate in physical activities, or take public transportation until you are done taking narcotic pain medicines or as directed by Dr. Briscoe Deutscher.  Do not drink alcohol or take tranquilizers.  Do not take medicine that has not been prescribed by your physicians.  Do not sign important papers or make important decisions while on narcotic pain medicines.  Have a responsible person with you.   CARE OF INCISION If you have a bandage, you may remove it in one day.  If there are steri-strips or dermabond, just let this loosen on its own.  You may shower on the first day after your surgery.  Do not sit in a tub bath for one week. Avoid heavy lifting (more than 10 pounds/4.5 kilograms), pushing, or pulling.  Avoid activities that may risk injury to your incisions.   PAIN MANAGEMENT Ibuprofen 800mg .  (This is the same as 4-200mg  over the counter tablets of Motrin or ibuprofen.)  Take this every 6 hours or as needed for cramping.   Acetaminophen 1000mg  (This is the same as 2-500mg  over the counter extra strength tylenol). Take this every 6 hours for the first 3 days or as needed afterwards for pain Oxycodone 5mg  For more severe pain, take one or two tablets every four to six hours as needed for pain control.  (Remember that narcotic pain medications increase your risk of constipation.  If this becomes a problem, you may take an over the counter laxative like miralax.)  DO'S AND DON'T'S Do not take a tub bath for 2 weeks.  You may shower on the first day after your surgery Do not do any heavy lifting for one to two weeks.  This increases the chance of bleeding. Do move around as you feel able.  Stairs are fine.  You may begin to exercise again as  you feel able.  Do not lift any weights for two weeks. Do not put anything in the vagina for two weeks--no tampons, intercourse, or douching.    REGULAR MEDIATIONS/VITAMINS: You may restart all of your regular medications as prescribed. You may restart all of your vitamins as you normally take them.    PLEASE CALL OR SEEK MEDICAL CARE IF: You have persistent nausea and vomiting.  You have trouble eating or drinking.  You have an oral temperature above 100.5.  You have constipation that is not helped by adjusting diet or increasing fluid intake. Pain medicines are a common cause of constipation.  You have heavy vaginal bleeding You have redness or drainage from your incision(s) or there is increasing pain or tenderness near or in the surgical site.

## 2024-07-12 NOTE — Anesthesia Preprocedure Evaluation (Signed)
"                                    Anesthesia Evaluation  Patient identified by MRN, date of birth, ID band Patient awake    Reviewed: Allergy & Precautions, NPO status , Patient's Chart, lab work & pertinent test results  History of Anesthesia Complications Negative for: history of anesthetic complications  Airway Mallampati: I  TM Distance: >3 FB Neck ROM: Full    Dental no notable dental hx. (+) Teeth Intact   Pulmonary asthma , neg sleep apnea, neg COPD, Patient abstained from smoking.Not current smoker   Pulmonary exam normal breath sounds clear to auscultation       Cardiovascular Exercise Tolerance: Good METS(-) hypertension(-) CAD and (-) Past MI negative cardio ROS (-) dysrhythmias  Rhythm:Regular Rate:Normal - Systolic murmurs    Neuro/Psych negative neurological ROS  negative psych ROS   GI/Hepatic ,neg GERD  ,,(+)     substance abuse  marijuana use  Endo/Other  neg diabetes    Renal/GU negative Renal ROS     Musculoskeletal   Abdominal   Peds  Hematology   Anesthesia Other Findings Past Medical History: No date: Asthma No date: Eczema  Reproductive/Obstetrics                              Anesthesia Physical Anesthesia Plan  ASA: 2  Anesthesia Plan: General   Post-op Pain Management: Tylenol  PO (pre-op)* and Toradol  IV (intra-op)*   Induction: Intravenous  PONV Risk Score and Plan: 4 or greater and Ondansetron , Dexamethasone  and Midazolam   Airway Management Planned: Oral ETT  Additional Equipment: None  Intra-op Plan:   Post-operative Plan: Extubation in OR  Informed Consent: I have reviewed the patients History and Physical, chart, labs and discussed the procedure including the risks, benefits and alternatives for the proposed anesthesia with the patient or authorized representative who has indicated his/her understanding and acceptance.     Dental advisory given  Plan Discussed with:  CRNA and Surgeon  Anesthesia Plan Comments: (Discussed risks of anesthesia with patient, including PONV, sore throat, lip/dental/eye damage. Rare risks discussed as well, such as cardiorespiratory and neurological sequelae, and allergic reactions. Discussed the role of CRNA in patient's perioperative care. Patient understands.)        Anesthesia Quick Evaluation  "

## 2024-07-12 NOTE — Op Note (Signed)
 Tonya Harding PROCEDURE DATE: 07/12/2024  PREOPERATIVE DIAGNOSIS: dermoid cyst  POSTOPERATIVE DIAGNOSIS: ovarian cyst/mass PROCEDURE:  operative hysteroscopy, dilation and curettage, diagnostic laparoscopy SURGEON: Ericson Nafziger, MD ASSISTANT:  Jorene Moats, PA  An experienced assistant was required given the standard of surgical care given the complexity of the case.  This assistant was needed for exposure, dissection, suctioning, retraction, instrument exchange, and for overall help during the procedure.  INDICATIONS: 24 y.o. G1P0010 with ovarian cyst.  Risks of surgery were discussed with the patient including but not limited to: bleeding which may require transfusion; infection which may require antibiotics; injury to surrounding organs; need for additional procedures including laparotomy;  and other postoperative/anesthesia complications. Written informed consent was obtained.    FINDINGS:  Normal external genitalia, 8 wk size mobile uterus with Normal contours.  Laparoscopically: normal upper abdominal survey, normal sized uterus, normal right fallopian tube, edematous left fallopian tube, normal right ovary and fallopian tube, unable fully visualize left fallopian tube and utero-ovarian ligament though part of fimbria were visualized, anterior left ovary adhesed into posterior cul de sac/posterior wall with adhesions to the posterior lower uterine segment and cul de sac, unable to visualized posterior portion of ovary that appeared to be adhered to the mesentary/peritoneum of the sigmoid colon at the pelvic brim Hysteroscopically: bilateral tubal ostia visualized, no masses appreciated   ANESTHESIA: General, laparoscopic bilateral TAP block INTRAVENOUS FLUIDS:  800 ml of LR ESTIMATED BLOOD LOSS:  5 ml URINE OUTPUT: 150 ml SPECIMENS: pelvic washings,  COMPLICATIONS:  None immediate.  FLUID DEFICIT: 60 ml normal saline  PROCEDURE: The risks, benefits, and alternatives of surgery  were explained, understood, and accepted. Consents were signed. All questions were answered. She was taken to the operating room and general anesthesia was applied without complication. She was placed in the dorsal lithotomy position and her abdomen and vagina were prepped and draped after she had been carefully positioned on the table. A bimanual exam revealed a 8 week size uterus that was mobile. Her right adnexa were not enlarged and there was fullness in the left adnexa. A Foley catheter was placed and it drained clear throughout the case. A speculum was placed and the cervix visualized. The anterior lip of the cervix was grasped and the hysteroscope introduced. The above findings noted and the scope removed. The endometrial cavity was gently curetted in all dour quadrants.  Gloves were changed and attention was turned to the abdomen. An 8mm incision was made in the LUQ and an optiview airseal trocar was introduced into the abdomen. The Entry was confirmed with low opening intraabdominal pressure and visualization and the abdomen was then insufflated. After good pneumoperitoneum was established, the abdomen was surveyed including the upper abdomen. Laparoscopic bilateral TAP block was completed with 1:1 solution of 0.25% bupivicaine and exparel . She was placed in Trendelenburg position and ports were placed in appropriate positions on her abdomen to allow maximum exposure during the robotic case. Specifically, trocars were placed supraumbilically, in the LLQ, RLQ, and RUQ under direct laparoscopic visualization. At this time, it was noted that the ovary could not be fully visualized. The sigmoid colon was overlying the mass but could not be displaced medially to view the adnexa. The ovarian mass appeared to be part of the mesentery from the superior view. Pelvic washings were then collected. Was able to see the fimbrieated end of the left fallopian tube, and some visualization of the round, but unable to trace  the fallopian tube. Attempted to visualize the  posterior cul de sac and the anterior surface of the ovary could be visualized as well as adhesions. At this time, decision made for intraoperative general surgery consult, who expressed concern for mesenteric mass and unclear vascularity and contents and would not recommend removal at this time without additional imaging. At this time, decision made to abort plan for ovarian cystectomy.  The intraabdominal pressure was lowered.   The skin from all of the other ports was closed with 4-0 vicryl. The patient was then extubated and taken to recovery in stable condition.   Sponge, lap and needle counts were correct x 2.   Carter Quarry, MD Minimally Invasive Gynecologic Surgery  Obstetrics and Gynecology, [facility], [facility] 07/12/2024

## 2024-07-12 NOTE — OR Nursing (Signed)
 Patient still in PACU as she was waiting for her mother to get off from work.

## 2024-07-12 NOTE — Interval H&P Note (Signed)
 History and Physical Interval Note:  07/12/2024 10:32 AM  Rosio L Coldwell  has presented today for surgery, with the diagnosis of Dermoid cyst Pelvic pain Iud insert POLYP.  The various methods of treatment have been discussed with the patient and family. After consideration of risks, benefits and other options for treatment, the patient has consented to  Procedures with comments: EXCISION, CYST, OVARY, ROBOT-ASSISTED, LAPAROSCOPIC (N/A) DILATATION AND CURETTAGE /HYSTEROSCOPY (N/A) - Hysteroscopy/Polyp resect/D&C , IUD insert - , Robotic Assist ovarian cystectomy INSERTION, INTRAUTERINE DEVICE (N/A) as a surgical intervention.  The patient's history has been reviewed, patient examined, no change in status, stable for surgery.  I have reviewed the patient's chart and labs.  Questions were answered to the patient's satisfaction.     [name]

## 2024-07-12 NOTE — Brief Op Note (Addendum)
 07/12/2024 11:23 AM  12:23 PM  PATIENT:  Tonya Harding  24 y.o. female  PRE-OPERATIVE DIAGNOSIS:  Dermoid cyst Pelvic pain Iud insert POLYP  POST-OPERATIVE DIAGNOSIS:  Dermoid cystPelvic painIud insertPOLYP  PROCEDURE:  Procedures: DILATATION AND CURETTAGE /HYSTEROSCOPY (N/A) LAPAROSCOPY, DIAGNOSTIC  SURGEON:  Surgeons and Role:    DEWAINE Jeralyn Crutch, MD - Primary  PHYSICIAN ASSISTANT: Jorene Moats, PA  ASSISTANTS: none   ANESTHESIA:   general and laparoscopic bilateral TAP block  EBL:  5 mL   BLOOD ADMINISTERED:none  DRAINS: none   LOCAL MEDICATIONS USED:  BUPIVICAINE  and OTHER exparel   SPECIMEN:  Source of Specimen:  pelvic washings, endometrial curettings  DISPOSITION OF SPECIMEN:  PATHOLOGY  COUNTS:  YES  TOURNIQUET:  * No tourniquets in log *  DICTATION: .Note written in EPIC  PLAN OF CARE: Discharge to home after PACU  PATIENT DISPOSITION:  PACU - hemodynamically stable.   Delay start of Pharmacological VTE agent (>24hrs) due to surgical blood loss or risk of bleeding: not applicable

## 2024-07-12 NOTE — Anesthesia Procedure Notes (Signed)
 Procedure Name: Intubation Date/Time: 07/12/2024 11:23 AM  Performed by: Erick Fitz, CRNAPre-anesthesia Checklist: Patient identified, Emergency Drugs available, Suction available, Patient being monitored and Timeout performed Patient Re-evaluated:Patient Re-evaluated prior to induction Oxygen Delivery Method: Circle system utilized Preoxygenation: Pre-oxygenation with 100% oxygen Induction Type: IV induction Ventilation: Mask ventilation without difficulty Laryngoscope Size: Mac and 3 Grade View: Grade I Tube type: Oral Tube size: 7.0 mm Number of attempts: 2 Airway Equipment and Method: Stylet Placement Confirmation: ETT inserted through vocal cords under direct vision, positive ETCO2, CO2 detector and breath sounds checked- equal and bilateral Secured at: 22 cm Tube secured with: Tape (secured with pink Hy-tape) Dental Injury: Teeth and Oropharynx as per pre-operative assessment

## 2024-07-13 ENCOUNTER — Telehealth: Payer: Self-pay | Admitting: Family Medicine

## 2024-07-13 ENCOUNTER — Encounter (HOSPITAL_COMMUNITY): Payer: Self-pay | Admitting: Obstetrics and Gynecology

## 2024-07-13 NOTE — Telephone Encounter (Signed)
 Called patient and left detailed message about post op appt. Left office number for pt if she needs to reschedule.

## 2024-07-13 NOTE — Addendum Note (Signed)
 Addended by: Cainan Trull, SOLA O on: 07/13/2024 05:13 PM   Modules accepted: Orders

## 2024-07-15 LAB — CYTOLOGY - NON PAP

## 2024-07-16 LAB — SURGICAL PATHOLOGY

## 2024-07-17 ENCOUNTER — Encounter (HOSPITAL_COMMUNITY): Payer: Self-pay

## 2024-07-18 ENCOUNTER — Encounter (HOSPITAL_COMMUNITY): Payer: Self-pay

## 2024-07-18 ENCOUNTER — Ambulatory Visit (HOSPITAL_COMMUNITY)

## 2024-07-19 ENCOUNTER — Ambulatory Visit: Payer: Self-pay | Admitting: Obstetrics and Gynecology

## 2024-07-24 NOTE — Telephone Encounter (Signed)
-----   Message from Carter Quarry, MD sent at 07/20/2024 11:58 AM EST ----- Regarding: reschedule mri Hello,  MRI has been approved, please help re-schedule imaging study.  Thank you,  Ajewole  ----- Message ----- From: Gladis Givens Sent: 07/20/2024  11:57 AM EST To: [place]  [id number] (Dx [id number], [id number]) APPROVED 07/20/2024 THRU 09/03/2024, ref. #[id number]; per [facility] Provider Rep. Randall DRAFTS  07/20/2024 (rym) ----- Message ----- From: Quarry Carter, MD Sent: 07/19/2024   3:04 PM EST To: Givens Gladis  Need help with getting MRI scheduled

## 2024-07-26 NOTE — Telephone Encounter (Signed)
 Attempted to contact unable to leave voicemail as mailbox voicemail not set up.  Letter sent.   Virgie Chery,RN

## 2024-07-31 ENCOUNTER — Encounter: Payer: Self-pay | Admitting: Obstetrics and Gynecology
